# Patient Record
Sex: Male | Born: 1985
Health system: Southern US, Community
[De-identification: ages and names within clinical notes are randomized; demographics above are authoritative.]

## PROBLEM LIST (undated history)

## (undated) DIAGNOSIS — H409 Unspecified glaucoma: Secondary | ICD-10-CM

## (undated) DIAGNOSIS — E739 Lactose intolerance, unspecified: Secondary | ICD-10-CM

## (undated) DIAGNOSIS — I10 Essential (primary) hypertension: Secondary | ICD-10-CM

## (undated) DIAGNOSIS — R61 Generalized hyperhidrosis: Secondary | ICD-10-CM

## (undated) DIAGNOSIS — M549 Dorsalgia, unspecified: Secondary | ICD-10-CM

## (undated) HISTORY — PX: JOINT REPLACEMENT: SHX530

## (undated) HISTORY — PX: KNEE SURGERY: SHX244

## (undated) HISTORY — DX: Lactose intolerance, unspecified: E73.9

## (undated) HISTORY — DX: Dorsalgia, unspecified: M54.9

## (undated) HISTORY — DX: Unspecified glaucoma: H40.9

---

## 2010-02-25 ENCOUNTER — Emergency Department (HOSPITAL_BASED_OUTPATIENT_CLINIC_OR_DEPARTMENT_OTHER)
Admission: EM | Admit: 2010-02-25 | Discharge: 2010-02-25 | Payer: Self-pay | Source: Home / Self Care | Admitting: Emergency Medicine

## 2010-03-03 LAB — DIFFERENTIAL
Basophils Absolute: 0 10*3/uL (ref 0.0–0.1)
Basophils Relative: 0 % (ref 0–1)
Eosinophils Absolute: 0.1 10*3/uL (ref 0.0–0.7)
Eosinophils Relative: 2 % (ref 0–5)
Lymphocytes Relative: 30 % (ref 12–46)
Lymphs Abs: 1.1 10*3/uL (ref 0.7–4.0)
Monocytes Absolute: 0.3 10*3/uL (ref 0.1–1.0)
Monocytes Relative: 9 % (ref 3–12)
Neutro Abs: 2.1 10*3/uL (ref 1.7–7.7)
Neutrophils Relative %: 59 % (ref 43–77)

## 2010-03-03 LAB — BASIC METABOLIC PANEL
BUN: 10 mg/dL (ref 6–23)
CO2: 28 mEq/L (ref 19–32)
Calcium: 9.5 mg/dL (ref 8.4–10.5)
Chloride: 106 mEq/L (ref 96–112)
Creatinine, Ser: 1.1 mg/dL (ref 0.4–1.5)
GFR calc Af Amer: >60 mL/min (ref >60)
GFR calc non Af Amer: >60 mL/min (ref >60)
Glucose, Bld: 84 mg/dL (ref 70–99)
Potassium: 3.9 mEq/L (ref 3.5–5.1)
Sodium: 144 mEq/L (ref 135–145)

## 2010-03-03 LAB — CBC
HCT: 40 % (ref 39.0–52.0)
Hemoglobin: 14 g/dL (ref 13.0–17.0)
MCH: 29.6 pg (ref 26.0–34.0)
MCHC: 35 g/dL (ref 30.0–36.0)
MCV: 84.6 fL (ref 78.0–100.0)
Platelets: 207 K/uL (ref 150–400)
RBC: 4.73 MIL/uL (ref 4.22–5.81)
RDW: 13.2 % (ref 11.5–15.5)
WBC: 3.5 K/uL — ABNORMAL LOW (ref 4.0–10.5)

## 2010-05-10 ENCOUNTER — Emergency Department (HOSPITAL_BASED_OUTPATIENT_CLINIC_OR_DEPARTMENT_OTHER)
Admission: EM | Admit: 2010-05-10 | Discharge: 2010-05-10 | Disposition: A | Discharge status: Home / Self Care | Payer: Self-pay | Attending: Emergency Medicine | Admitting: Emergency Medicine

## 2010-05-10 DIAGNOSIS — R51 Headache: Secondary | ICD-10-CM | POA: Insufficient documentation

## 2010-05-10 DIAGNOSIS — F17200 Nicotine dependence, unspecified, uncomplicated: Secondary | ICD-10-CM | POA: Insufficient documentation

## 2010-05-12 ENCOUNTER — Emergency Department (INDEPENDENT_AMBULATORY_CARE_PROVIDER_SITE_OTHER): Payer: Self-pay

## 2010-05-12 ENCOUNTER — Emergency Department (HOSPITAL_BASED_OUTPATIENT_CLINIC_OR_DEPARTMENT_OTHER)
Admission: EM | Admit: 2010-05-12 | Discharge: 2010-05-12 | Disposition: A | Discharge status: Home / Self Care | Payer: Self-pay | Attending: Emergency Medicine | Admitting: Emergency Medicine

## 2010-05-12 DIAGNOSIS — H60399 Other infective otitis externa, unspecified ear: Secondary | ICD-10-CM | POA: Insufficient documentation

## 2010-05-12 DIAGNOSIS — F17200 Nicotine dependence, unspecified, uncomplicated: Secondary | ICD-10-CM | POA: Insufficient documentation

## 2010-05-12 DIAGNOSIS — R51 Headache: Secondary | ICD-10-CM | POA: Insufficient documentation

## 2010-05-12 DIAGNOSIS — H9209 Otalgia, unspecified ear: Secondary | ICD-10-CM

## 2010-05-12 MED ORDER — IOHEXOL 300 MG/ML  SOLN: 80.0000 mL | Freq: Once | INTRAMUSCULAR | Status: DC | PRN

## 2010-09-29 ENCOUNTER — Emergency Department (HOSPITAL_BASED_OUTPATIENT_CLINIC_OR_DEPARTMENT_OTHER)
Admission: EM | Admit: 2010-09-29 | Discharge: 2010-09-29 | Disposition: A | Discharge status: Home / Self Care | Payer: Self-pay | Attending: Emergency Medicine | Admitting: Emergency Medicine

## 2010-09-29 DIAGNOSIS — R229 Localized swelling, mass and lump, unspecified: Secondary | ICD-10-CM | POA: Insufficient documentation

## 2010-09-29 DIAGNOSIS — S0990XA Unspecified injury of head, initial encounter: Secondary | ICD-10-CM | POA: Insufficient documentation

## 2010-09-29 DIAGNOSIS — R220 Localized swelling, mass and lump, head: Secondary | ICD-10-CM

## 2010-09-29 DIAGNOSIS — Y9289 Other specified places as the place of occurrence of the external cause: Secondary | ICD-10-CM | POA: Insufficient documentation

## 2010-09-29 MED ORDER — IBUPROFEN 800 MG PO TABS: 800.0000 mg | ORAL_TABLET | Freq: Three times a day (TID) | ORAL | Status: AC

## 2010-09-29 NOTE — ED Notes (Signed)
Was swimming in the ocean and felt like he hit head on something-c/o pain/swelling to right parietal area

## 2010-09-29 NOTE — ED Notes (Signed)
Pt denies n/v, visual changes, dizziness. Pt c/o headache to right posterior head.

## 2010-09-29 NOTE — ED Provider Notes (Signed)
History     CSN: [id number] Arrival date & time: 09/29/2010 11:15 AM  Chief Complaint  Patient presents with  . Head Injury   Patient is a 25 y.o. male presenting with head injury. The history is provided by the patient.  Head Injury  The incident occurred more than 2 days ago. He came to the ER via walk-in. The injury mechanism was a direct blow. There was no loss of consciousness. There was no blood loss. The quality of the pain is described as dull and throbbing. The pain is at a severity of 5/10. The pain is moderate. The pain has been constant since the injury. Pertinent negatives include no numbness, no blurred vision, no vomiting and no tinnitus. He has tried acetaminophen for the symptoms. The treatment provided mild relief.  Pt reports that he was stung by something or hit his head on something at the beach.  Pt thinks he may have been stung by a jelly fish.  Pt complains of a hard knot on his head.  History reviewed. No pertinent past medical history.  History reviewed. No pertinent past surgical history.  No family history on file.  History  Substance Use Topics  . Smoking status: Current Everyday Smoker  . Smokeless tobacco: Not on file  . Alcohol Use: No      Review of Systems  HENT: Negative for tinnitus.   Eyes: Negative for blurred vision.  Gastrointestinal: Negative for vomiting.  Skin: Positive for wound.  Neurological: Negative for numbness.  All other systems reviewed and are negative.    Physical Exam  BP 130/84  Pulse 79  Temp(Src) 98.6 F (37 C) (Oral)  Resp 16  Ht 6\' 2"  (1.88 m)  Wt 204 lb (92.534 kg)  BMI 26.19 kg/m2  SpO2 100%  Physical Exam  Nursing note and vitals reviewed. Constitutional: He appears well-developed and well-nourished.  HENT:  Head: Normocephalic and atraumatic.  Eyes: Conjunctivae and EOM are normal. Pupils are equal, round, and reactive to light.  Neck: Normal range of motion. Neck supple.  Cardiovascular: Normal  rate.   Pulmonary/Chest: Effort normal.  Musculoskeletal: Normal range of motion.       Tender swollen area right scalp,    Neurological: He is alert.  Skin: Skin is warm.  Psychiatric: He has a normal mood and affect.    ED Course  Procedures  MDM I advised ice, ibuprofen,  I doubt head injury.      Langston Masker, [place] 09/29/10 1241

## 2010-09-30 NOTE — ED Provider Notes (Signed)
Evaluation and management procedures were performed by the PA/NP under my supervision/collaboration.   Dione Booze, MD 09/30/10 [phone]

## 2010-10-30 ENCOUNTER — Encounter (HOSPITAL_BASED_OUTPATIENT_CLINIC_OR_DEPARTMENT_OTHER): Payer: Self-pay | Admitting: *Deleted

## 2010-10-30 ENCOUNTER — Emergency Department (HOSPITAL_BASED_OUTPATIENT_CLINIC_OR_DEPARTMENT_OTHER)
Admission: EM | Admit: 2010-10-30 | Discharge: 2010-10-30 | Disposition: A | Discharge status: Home / Self Care | Payer: Self-pay | Attending: Emergency Medicine | Admitting: Emergency Medicine

## 2010-10-30 DIAGNOSIS — R51 Headache: Secondary | ICD-10-CM | POA: Insufficient documentation

## 2010-10-30 DIAGNOSIS — G8929 Other chronic pain: Secondary | ICD-10-CM | POA: Insufficient documentation

## 2010-10-30 MED ORDER — IBUPROFEN 800 MG PO TABS: 800.0000 mg | ORAL_TABLET | Freq: Three times a day (TID) | ORAL | Status: AC

## 2010-10-30 MED ORDER — SUMATRIPTAN SUCCINATE 50 MG PO TABS: 50.0000 mg | ORAL_TABLET | ORAL | Status: DC | PRN

## 2010-10-30 MED ORDER — SUMATRIPTAN SUCCINATE 100 MG PO TABS: 100.0000 mg | ORAL_TABLET | ORAL | Status: DC | PRN

## 2010-10-30 MED ORDER — KETOROLAC TROMETHAMINE 60 MG/2ML IM SOLN
60.0000 mg | Freq: Once | INTRAMUSCULAR | Status: AC
  Administered 2010-10-30: 60 mg via INTRAMUSCULAR
  Filled 2010-10-30: qty 2

## 2010-10-30 MED ORDER — DIPHENHYDRAMINE HCL 25 MG PO CAPS
25.0000 mg | ORAL_CAPSULE | Freq: Once | ORAL | Status: AC
  Administered 2010-10-30: 25 mg via ORAL
  Filled 2010-10-30 (×2): qty 1

## 2010-10-30 NOTE — ED Notes (Signed)
Dr Miller at bedside. 

## 2010-10-30 NOTE — ED Provider Notes (Signed)
History     CSN: [id number] Arrival date & time: 10/30/2010  6:13 AM  Chief Complaint  Patient presents with  . Headache   HPI Comments: She states that he has had headaches for the past 3 months which are intermittent, nothing makes it better or worse, will eventually resolve spontaneously, unilateral on the right and a throbbing feeling. He has no associated fever, stiff neck, weakness, numbness, blurred vision. He does note that he has an aura that precedes each of his headaches which includes seeing blurry lines across his vision. He exercises daily including cardio and weights and has minimal difficulty with this task. He has tried Tylenol prior to arrival with no improvement.  He does not drink caffeine, use alcohol, or drugs. He has the occasional cigarette. He denies any other prescription or over-the-counter medications and uses no supplement  Patient is a 25 y.o. male presenting with headaches. The history is provided by the patient, medical records and a relative.  Headache  This is a chronic problem. Episode onset: 3 months ago. Episode frequency: Intermittently, every other week. The problem has not changed since onset.The headache is associated with nothing. The pain is located in the right unilateral region. The quality of the pain is described as throbbing. The pain is moderate. The pain does not radiate. Pertinent negatives include no anorexia, no fever, no malaise/fatigue, no chest pressure, no near-syncope, no orthopnea, no palpitations, no syncope, no shortness of breath, no nausea and no vomiting. Associated symptoms comments: Positive for photophobia and phonophobia. Has pre-headache aura. He has tried acetaminophen for the symptoms. The treatment provided no relief.    History reviewed. No pertinent past medical history.  History reviewed. No pertinent past surgical history.  No family history on file.  History  Substance Use Topics  . Smoking status: Current Some Day  Smoker  . Smokeless tobacco: Not on file  . Alcohol Use: No      Review of Systems  Constitutional: Negative for fever and malaise/fatigue.  Respiratory: Negative for shortness of breath.   Cardiovascular: Negative for palpitations, orthopnea, syncope and near-syncope.  Gastrointestinal: Negative for nausea, vomiting and anorexia.  Neurological: Positive for headaches.  All other systems reviewed and are negative.    Physical Exam  BP 135/85  Pulse 70  Temp(Src) 98.2 F (36.8 C) (Oral)  Resp 18  Ht 6\' 2"  (1.88 m)  Wt 204 lb (92.534 kg)  BMI 26.19 kg/m2  SpO2 100%  Physical Exam  Nursing note and vitals reviewed. Constitutional: He appears well-developed and well-nourished. No distress.  HENT:  Head: Normocephalic and atraumatic.  Mouth/Throat: Oropharynx is clear and moist. No oropharyngeal exudate.       No tenderness to palpation over the scalp or temporal artery  Eyes: Conjunctivae and EOM are normal. Pupils are equal, round, and reactive to light. Right eye exhibits no discharge. Left eye exhibits no discharge. No scleral icterus.  Neck: Normal range of motion. Neck supple. No JVD present. No thyromegaly present.  Cardiovascular: Normal rate, regular rhythm, normal heart sounds and intact distal pulses.  Exam reveals no gallop and no friction rub.   No murmur heard. Pulmonary/Chest: Effort normal and breath sounds normal. No respiratory distress. He has no wheezes. He has no rales.  Abdominal: Soft. Bowel sounds are normal. He exhibits no distension and no mass. There is no tenderness.  Musculoskeletal: Normal range of motion. He exhibits no edema and no tenderness.  Lymphadenopathy:    He has no cervical adenopathy.  Neurological: He is alert. Coordination normal.  Skin: Skin is warm and dry. No rash noted. No erythema.  Psychiatric: He has a normal mood and affect. His behavior is normal.    ED Course  Procedures  MDM Very normal exam, vital signs,  intramuscular Toradol. Likely has a migraine disorder and will need followup with neurology. We'll give prescription for Imitrex at home. Precautions for followup given. Understanding expressed      Vida Roller, MD 10/30/10 [phone]

## 2010-10-30 NOTE — ED Notes (Signed)
C/o intermittent headaches x3 months. Most recent HA started 2nights ago. Pt has sensitivity to light and noise. C/o dizziness yesterday. Denies N/V or fevers.

## 2011-02-05 ENCOUNTER — Encounter (HOSPITAL_BASED_OUTPATIENT_CLINIC_OR_DEPARTMENT_OTHER): Payer: Self-pay | Admitting: *Deleted

## 2011-02-05 ENCOUNTER — Emergency Department (HOSPITAL_BASED_OUTPATIENT_CLINIC_OR_DEPARTMENT_OTHER)
Admission: EM | Admit: 2011-02-05 | Discharge: 2011-02-05 | Disposition: A | Discharge status: Home / Self Care | Payer: Self-pay | Attending: Emergency Medicine | Admitting: Emergency Medicine

## 2011-02-05 DIAGNOSIS — S29012A Strain of muscle and tendon of back wall of thorax, initial encounter: Secondary | ICD-10-CM

## 2011-02-05 DIAGNOSIS — Y9289 Other specified places as the place of occurrence of the external cause: Secondary | ICD-10-CM | POA: Insufficient documentation

## 2011-02-05 DIAGNOSIS — S239XXA Sprain of unspecified parts of thorax, initial encounter: Secondary | ICD-10-CM | POA: Insufficient documentation

## 2011-02-05 DIAGNOSIS — X503XXA Overexertion from repetitive movements, initial encounter: Secondary | ICD-10-CM | POA: Insufficient documentation

## 2011-02-05 MED ORDER — KETOROLAC TROMETHAMINE 60 MG/2ML IM SOLN
60.0000 mg | Freq: Once | INTRAMUSCULAR | Status: AC
  Administered 2011-02-05: 60 mg via INTRAMUSCULAR
  Filled 2011-02-05: qty 2

## 2011-02-05 MED ORDER — CYCLOBENZAPRINE HCL 10 MG PO TABS: 10.0000 mg | ORAL_TABLET | Freq: Two times a day (BID) | ORAL | Status: AC | PRN

## 2011-02-05 NOTE — ED Provider Notes (Signed)
History     CSN: [id number] Arrival date & time: 02/05/2011  6:41 AM   None     Chief Complaint  Patient presents with  . Back Pain    (Consider location/radiation/quality/duration/timing/severity/associated sxs/prior treatment) Patient is a 25 y.o. male presenting with back pain. The history is provided by the patient.  Back Pain  This is a new problem. The current episode started 12 to 24 hours ago. The problem occurs constantly. The problem has been gradually worsening. The pain is associated with lifting heavy objects (He was throwing mailbags and felt a pop in his right side). The pain is present in the lumbar spine. The quality of the pain is described as stabbing and shooting. The pain does not radiate. The pain is at a severity of 10/10. The pain is severe. The symptoms are aggravated by bending, twisting and certain positions. The pain is the same all the time. Pertinent negatives include no chest pain, no numbness, no leg pain, no paresthesias, no paresis, no tingling and no weakness. He has tried ice and heat for the symptoms. The treatment provided no relief.    Past Medical History  Diagnosis Date  . Migraine     History reviewed. No pertinent past surgical history.  No family history on file.  History  Substance Use Topics  . Smoking status: Former Games developer  . Smokeless tobacco: Not on file  . Alcohol Use: No      Review of Systems  Cardiovascular: Negative for chest pain.  Musculoskeletal: Positive for back pain.  Neurological: Negative for tingling, weakness, numbness and paresthesias.  All other systems reviewed and are negative.    Allergies  Review of patient's allergies indicates no known allergies.  Home Medications   Current Outpatient Rx  Name Route Sig Dispense Refill  . SUMATRIPTAN SUCCINATE 50 MG PO TABS Oral Take 1 tablet (50 mg total) by mouth every 2 (two) hours as needed for migraine (maximum 200mg  in one day). 20 tablet 0    BP  146/64  Pulse 66  Temp(Src) 98.1 F (36.7 C) (Oral)  Resp 16  Ht 6\' 2"  (1.88 m)  Wt 201 lb (91.173 kg)  BMI 25.81 kg/m2  SpO2 99%  Physical Exam  Nursing note and vitals reviewed. Constitutional: He is oriented to person, place, and time. He appears well-developed and well-nourished.       Uncomfortable appearing leaning forward in the bed  HENT:  Head: Normocephalic and atraumatic.  Eyes: EOM are normal. Pupils are equal, round, and reactive to light.  Cardiovascular: Normal rate, normal heart sounds and intact distal pulses.   Pulmonary/Chest: Effort normal and breath sounds normal. He has no wheezes. He has no rales. He exhibits no tenderness.  Musculoskeletal:       Thoracic back: He exhibits tenderness, pain and spasm. He exhibits no bony tenderness.       Arms: Neurological: He is alert and oriented to person, place, and time. He has normal strength. No sensory deficit.  Skin: Skin is warm and dry. No erythema.    ED Course  Procedures (including critical care time)  Labs Reviewed - No data to display No results found.   No diagnosis found.    MDM   Patient with symptoms consistent with muscle sprain. 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 Pain only in the right latissimus dorsi. No shortness of breath or respiratory symptoms. No neurologic symptoms the pain  does not radiate into the legs. Will treat with anti-inflammatories and muscle relaxer.        Gwyneth Sprout, MD 02/05/11 [phone]

## 2011-02-05 NOTE — ED Notes (Signed)
C/o right sided low back pain after lifting heavy bags

## 2011-03-18 ENCOUNTER — Encounter (HOSPITAL_BASED_OUTPATIENT_CLINIC_OR_DEPARTMENT_OTHER): Payer: Self-pay | Admitting: *Deleted

## 2011-03-18 ENCOUNTER — Emergency Department (HOSPITAL_BASED_OUTPATIENT_CLINIC_OR_DEPARTMENT_OTHER)
Admission: EM | Admit: 2011-03-18 | Discharge: 2011-03-18 | Disposition: A | Discharge status: Home / Self Care | Payer: Self-pay | Attending: Emergency Medicine | Admitting: Emergency Medicine

## 2011-03-18 DIAGNOSIS — G43909 Migraine, unspecified, not intractable, without status migrainosus: Secondary | ICD-10-CM | POA: Insufficient documentation

## 2011-03-18 DIAGNOSIS — H53149 Visual discomfort, unspecified: Secondary | ICD-10-CM | POA: Insufficient documentation

## 2011-03-18 MED ORDER — METOCLOPRAMIDE HCL 5 MG/ML IJ SOLN
10.0000 mg | Freq: Once | INTRAMUSCULAR | Status: AC
  Administered 2011-03-18: 10 mg via INTRAVENOUS
  Filled 2011-03-18: qty 2

## 2011-03-18 NOTE — ED Provider Notes (Signed)
History     CSN: [id number]  Arrival date & time 03/18/11  1844   First MD Initiated Contact with Patient 03/18/11 1857      Chief Complaint  Patient presents with  . Migraine    (Consider location/radiation/quality/duration/timing/severity/associated sxs/prior treatment) HPI Complains of migraine headache throbbing in nature right-sided onset upon awakening this morning typical of migraine she gets 3 or 4 times per day for the past 2 years no associated nausea. Admits to photophobia no other complaint. Treated with sumatriptan without relief. Past Medical History  Diagnosis Date  . Migraine     Past Surgical History  Procedure Date  . Joint replacement     History reviewed. No pertinent family history.  History  Substance Use Topics  . Smoking status: Former Games developer  . Smokeless tobacco: Not on file  . Alcohol Use: No   No tobacco no alcohol no drug   Review of Systems  Constitutional: Negative.   HENT: Negative.   Eyes: Positive for photophobia.  Respiratory: Negative.   Cardiovascular: Negative.   Gastrointestinal: Negative.   Musculoskeletal: Negative.   Skin: Negative.   Neurological: Negative.        Headache  Hematological: Negative.   Psychiatric/Behavioral: Negative.     Allergies  Review of patient's allergies indicates no known allergies.  Home Medications   Current Outpatient Rx  Name Route Sig Dispense Refill  . ADULT MULTIVITAMIN W/MINERALS CH Oral Take 1 tablet by mouth daily.    . SUMATRIPTAN SUCCINATE 50 MG PO TABS Oral Take 1 tablet (50 mg total) by mouth every 2 (two) hours as needed for migraine (maximum 200mg  in one day). 20 tablet 0    BP 124/72  Pulse 74  Temp(Src) 99.4 F (37.4 C) (Oral)  Resp 18  Ht 6\' 2"  (1.88 m)  Wt 202 lb (91.627 kg)  BMI 25.94 kg/m2  SpO2 100%  Physical Exam  Constitutional: He appears well-developed and well-nourished.  HENT:  Head: Normocephalic and atraumatic.  Eyes: Conjunctivae are normal.  Pupils are equal, round, and reactive to light.       Fundi not well visualized  Neck: Neck supple. No tracheal deviation present. No thyromegaly present.  Cardiovascular: Normal rate and regular rhythm.   No murmur heard. Pulmonary/Chest: Effort normal and breath sounds normal.  Abdominal: Soft. Bowel sounds are normal. He exhibits no distension. There is no tenderness.  Musculoskeletal: Normal range of motion. He exhibits no edema and no tenderness.  Neurological: He is alert. He has normal reflexes. Coordination normal.       Gait normal pronator drift normal Romberg normal  Skin: Skin is warm and dry. No rash noted.  Psychiatric: He has a normal mood and affect. His behavior is normal. Judgment normal.    ED Course  Procedures (including critical care time) 8 PM feels much improved her vertigo home alert Glasgow Coma Score 15 Labs Reviewed - No data to display No results found.   No diagnosis found.    MDM   No further treatment needed, referral to [facility] Diagnosis migraine headache       Doug Sou, MD 03/18/11 2001

## 2011-03-18 NOTE — ED Notes (Signed)
Pt c/o " migraine all day" w/o relief from meds

## 2012-01-04 ENCOUNTER — Emergency Department (HOSPITAL_BASED_OUTPATIENT_CLINIC_OR_DEPARTMENT_OTHER)
Admission: EM | Admit: 2012-01-04 | Discharge: 2012-01-04 | Disposition: A | Discharge status: Home / Self Care | Payer: Self-pay | Attending: Emergency Medicine | Admitting: Emergency Medicine

## 2012-01-04 ENCOUNTER — Encounter (HOSPITAL_BASED_OUTPATIENT_CLINIC_OR_DEPARTMENT_OTHER): Payer: Self-pay | Admitting: *Deleted

## 2012-01-04 ENCOUNTER — Emergency Department (HOSPITAL_BASED_OUTPATIENT_CLINIC_OR_DEPARTMENT_OTHER): Payer: Self-pay

## 2012-01-04 DIAGNOSIS — G43909 Migraine, unspecified, not intractable, without status migrainosus: Secondary | ICD-10-CM | POA: Insufficient documentation

## 2012-01-04 DIAGNOSIS — I824Z9 Acute embolism and thrombosis of unspecified deep veins of unspecified distal lower extremity: Secondary | ICD-10-CM | POA: Insufficient documentation

## 2012-01-04 DIAGNOSIS — Z87891 Personal history of nicotine dependence: Secondary | ICD-10-CM | POA: Insufficient documentation

## 2012-01-04 DIAGNOSIS — Z79899 Other long term (current) drug therapy: Secondary | ICD-10-CM | POA: Insufficient documentation

## 2012-01-04 LAB — CBC WITH DIFFERENTIAL/PLATELET
Basophils Absolute: 0 10*3/uL (ref 0.0–0.1)
Basophils Relative: 0 % (ref 0–1)
Eosinophils Absolute: 0 10*3/uL (ref 0.0–0.7)
Eosinophils Relative: 1 % (ref 0–5)
HCT: 38 % — ABNORMAL LOW (ref 39.0–52.0)
Hemoglobin: 13.2 g/dL (ref 13.0–17.0)
Lymphocytes Relative: 31 % (ref 12–46)
Lymphs Abs: 1.5 10*3/uL (ref 0.7–4.0)
MCH: 30.7 pg (ref 26.0–34.0)
MCHC: 34.7 g/dL (ref 30.0–36.0)
MCV: 88.4 fL (ref 78.0–100.0)
Monocytes Absolute: 0.5 10*3/uL (ref 0.1–1.0)
Monocytes Relative: 11 % (ref 3–12)
Neutro Abs: 2.7 10*3/uL (ref 1.7–7.7)
Neutrophils Relative %: 57 % (ref 43–77)
Platelets: 191 10*3/uL (ref 150–400)
RBC: 4.3 MIL/uL (ref 4.22–5.81)
RDW: 13 % (ref 11.5–15.5)
WBC: 4.8 10*3/uL (ref 4.0–10.5)

## 2012-01-04 LAB — BASIC METABOLIC PANEL
BUN: 12 mg/dL (ref 6–23)
CO2: 27 mEq/L (ref 19–32)
Calcium: 9.8 mg/dL (ref 8.4–10.5)
Chloride: 101 mEq/L (ref 96–112)
Creatinine, Ser: 1.1 mg/dL (ref 0.50–1.35)
GFR calc Af Amer: >90 mL/min (ref >90)
GFR calc non Af Amer: >90 mL/min (ref >90)
Glucose, Bld: 89 mg/dL (ref 70–99)
Potassium: 4.5 mEq/L (ref 3.5–5.1)
Sodium: 138 mEq/L (ref 135–145)

## 2012-01-04 LAB — CK: Total CK: 656 U/L — ABNORMAL HIGH (ref 7–232)

## 2012-01-04 MED ORDER — ASPIRIN 325 MG PO TABS: 325.0000 mg | ORAL_TABLET | Freq: Every day | ORAL | Status: DC

## 2012-01-04 NOTE — ED Notes (Signed)
MD at bedside. 

## 2012-01-04 NOTE — ED Notes (Signed)
Returned from [facility]

## 2012-01-04 NOTE — ED Provider Notes (Signed)
History  This chart was scribed for [name], MD by Ardeen Jourdain, ED Scribe. This patient was seen in room MH10/MH10 and the patient's care was started at 1609.  CSN: [id number]  Arrival date & time 01/04/12  1450   First MD Initiated Contact with Patient 01/04/12 1609      Chief Complaint  Patient presents with  . Leg Swelling     The history is provided by the patient. No language interpreter was used.    Adam Alvarez is a 26 y.o. male who presents to the Emergency Department complaining of lower leg swelling with associated pain that began 2 days ago after running half marathon. He denies problems urinating, abdominal pain, fever, and SOB. He states that during the race his leg started swelling, burning and he felt pain. He reports breaking the knee in the past. He has a h/o migraines. He is a former smoker but denies alcohol use.   Past Medical History  Diagnosis Date  . Migraine     Past Surgical History  Procedure Date  . Joint replacement     History reviewed. No pertinent family history.  History  Substance Use Topics  . Smoking status: Former Games developer  . Smokeless tobacco: Not on file  . Alcohol Use: No      Review of Systems  All other systems reviewed and are negative.  A complete 10 system review of systems was obtained and all systems are negative except as noted in the HPI and PMH.    Allergies  Review of patient's allergies indicates no known allergies.  Home Medications   Current Outpatient Rx  Name  Route  Sig  Dispense  Refill  . ADULT MULTIVITAMIN W/MINERALS CH   Oral   Take 1 tablet by mouth daily.         . SUMATRIPTAN SUCCINATE 50 MG PO TABS   Oral   Take 1 tablet (50 mg total) by mouth every 2 (two) hours as needed for migraine (maximum 200mg  in one day).   20 tablet   0     Triage Vitals: BP 136/69  Pulse 70  Temp 98.8 F (37.1 C) (Oral)  Resp 16  Ht 6\' 3"  (1.905 m)  Wt 210 lb (95.255 kg)  BMI 26.25  kg/m2  SpO2 100%  Physical Exam  Nursing note and vitals reviewed. Constitutional: He is oriented to person, place, and time. He appears well-developed and well-nourished.  HENT:  Head: Normocephalic and atraumatic.  Eyes: EOM are normal. Pupils are equal, round, and reactive to light.  Neck: Normal range of motion. Neck supple.  Cardiovascular: Normal rate, normal heart sounds and intact distal pulses.   Pulmonary/Chest: Effort normal and breath sounds normal.  Abdominal: Bowel sounds are normal. He exhibits no distension. There is no tenderness.  Musculoskeletal: Normal range of motion. He exhibits tenderness. He exhibits no edema.       Tenderness and fullness behind right knee and into right calf, no edema, normal pulses  Neurological: He is alert and oriented to person, place, and time. He has normal strength. No cranial nerve deficit or sensory deficit.  Skin: Skin is warm and dry. No rash noted.  Psychiatric: He has a normal mood and affect.    ED Course  Procedures (including critical care time)  DIAGNOSTIC STUDIES: Oxygen Saturation is 100% on room air, normal by my interpretation.    COORDINATION OF CARE:  4:17 PM: Discussed treatment plan which includes blood work and  an ultrasound with pt at bedside and pt agreed to plan.    Results for orders placed during the hospital encounter of 01/04/12  CBC WITH DIFFERENTIAL      Component Value Range   WBC 4.8  4.0 - 10.5 K/uL   RBC 4.30  4.22 - 5.81 MIL/uL   Hemoglobin 13.2  13.0 - 17.0 g/dL   HCT 62.1 (*) 30.8 - 65.7 %   MCV 88.4  78.0 - 100.0 fL   MCH 30.7  26.0 - 34.0 pg   MCHC 34.7  30.0 - 36.0 g/dL   RDW 84.6  96.2 - 95.2 %   Platelets 191  150 - 400 K/uL   Neutrophils Relative 57  43 - 77 %   Neutro Abs 2.7  1.7 - 7.7 K/uL   Lymphocytes Relative 31  12 - 46 %   Lymphs Abs 1.5  0.7 - 4.0 K/uL   Monocytes Relative 11  3 - 12 %   Monocytes Absolute 0.5  0.1 - 1.0 K/uL   Eosinophils Relative 1  0 - 5 %    Eosinophils Absolute 0.0  0.0 - 0.7 K/uL   Basophils Relative 0  0 - 1 %   Basophils Absolute 0.0  0.0 - 0.1 K/uL  BASIC METABOLIC PANEL      Component Value Range   Sodium 138  135 - 145 mEq/L   Potassium 4.5  3.5 - 5.1 mEq/L   Chloride 101  96 - 112 mEq/L   CO2 27  19 - 32 mEq/L   Glucose, Bld 89  70 - 99 mg/dL   BUN 12  6 - 23 mg/dL   Creatinine, Ser 8.41  0.50 - 1.35 mg/dL   Calcium 9.8  8.4 - 32.4 mg/dL   GFR calc non Af Amer >90  >90 mL/min   GFR calc Af Amer >90  >90 mL/min  CK      Component Value Range   Total CK 656 (*) 7 - 232 U/L   US Venous Img Lower Unilateral Right  01/04/2012  *RADIOLOGY REPORT*  Clinical Data: Leg pain, swelling and numbness, recently ran a marathon  RIGHT LOWER EXTREMITY VENOUS DUPLEX ULTRASOUND  Technique:  Gray-scale sonography with graded compression, as well as color Doppler and duplex ultrasound, were performed to evaluate the deep venous system of the lower extremity from the level of the common femoral vein through the popliteal and proximal calf veins. Spectral Doppler was utilized to evaluate flow at rest and with distal augmentation maneuvers.  Comparison:  None  Findings: Deep venous system patent and compressible from right groin through popliteal fossa. Spontaneous venous flow present with intact augmentation and evidence of respiratory phasicity. No intraluminal thrombus identified. Visualized portion of the greater saphenous system unremarkable. No evidence of Baker's cyst identified.  In the right calf, the peroneal veins appear patent and compressible, and demonstrate spontaneous venous flow. The distal portions of the posterior tibial veins however demonstrate impaired compressibility and lack of spontaneous flow on color Doppler imaging. Findings represent deep venous thrombosis within the calf in the posterior tibial veins.  IMPRESSION: No evidence of above knee deep venous thrombosis. Impaired compressibility and spontaneous venous flow  within portions of the right posterior tibial veins most likely representing deep venous thrombosis in the calf.   Original Report Authenticated By: Ulyses Southward, M.D.      No diagnosis found.    MDM  Discussed [place] results with Dr. Arbie Cookey on call for Vascular Surgery.  He recommends a daily aspirin and recheck of [place] in 1 week to evaluate for propagation of the clot. Does not recommend anticoagulation at this time given distal findings. Pt has no PE symptoms. Has no established PCP, given resource guide and advised to return to the ED in one week if unable to schedule an appointment.       I personally performed the services described in this documentation, which was scribed in my presence. The recorded information has been reviewed and is accurate.    Denis Carreon B. Bernette Mayers, MD 01/04/12 [phone]

## 2012-01-04 NOTE — ED Notes (Signed)
Pt c/o right lower leg swelling and pain after running sat

## 2012-01-10 ENCOUNTER — Emergency Department (HOSPITAL_BASED_OUTPATIENT_CLINIC_OR_DEPARTMENT_OTHER): Payer: Self-pay

## 2012-01-10 ENCOUNTER — Encounter (HOSPITAL_BASED_OUTPATIENT_CLINIC_OR_DEPARTMENT_OTHER): Payer: Self-pay

## 2012-01-10 ENCOUNTER — Emergency Department (HOSPITAL_BASED_OUTPATIENT_CLINIC_OR_DEPARTMENT_OTHER)
Admission: EM | Admit: 2012-01-10 | Discharge: 2012-01-10 | Disposition: A | Discharge status: Home / Self Care | Payer: Self-pay | Attending: Emergency Medicine | Admitting: Emergency Medicine

## 2012-01-10 DIAGNOSIS — Z8669 Personal history of other diseases of the nervous system and sense organs: Secondary | ICD-10-CM | POA: Insufficient documentation

## 2012-01-10 DIAGNOSIS — I824Z9 Acute embolism and thrombosis of unspecified deep veins of unspecified distal lower extremity: Secondary | ICD-10-CM

## 2012-01-10 DIAGNOSIS — Z79899 Other long term (current) drug therapy: Secondary | ICD-10-CM | POA: Insufficient documentation

## 2012-01-10 DIAGNOSIS — Z87891 Personal history of nicotine dependence: Secondary | ICD-10-CM | POA: Insufficient documentation

## 2012-01-10 DIAGNOSIS — I82409 Acute embolism and thrombosis of unspecified deep veins of unspecified lower extremity: Secondary | ICD-10-CM | POA: Insufficient documentation

## 2012-01-10 DIAGNOSIS — Z7982 Long term (current) use of aspirin: Secondary | ICD-10-CM | POA: Insufficient documentation

## 2012-01-10 NOTE — ED Provider Notes (Signed)
History     CSN: [id number]  Arrival date & time 01/10/12  1133   First MD Initiated Contact with Patient 01/10/12 1144      Chief Complaint  Patient presents with  . Follow-up    (Consider location/radiation/quality/duration/timing/severity/associated sxs/prior treatment) HPI Pt presents for re-evaluation from visit about a week ago for RLE pain. At that time had [place] concerning for small distal DVT, started on ASA and advised to return for evaluation of propagation in one week. States pain and swelling is improved.   Past Medical History  Diagnosis Date  . Migraine     Past Surgical History  Procedure Date  . Joint replacement   . Knee surgery     No family history on file.  History  Substance Use Topics  . Smoking status: Former Games developer  . Smokeless tobacco: Not on file  . Alcohol Use: No      Review of Systems All other systems reviewed and are negative except as noted in HPI.   Allergies  Review of patient's allergies indicates no known allergies.  Home Medications   Current Outpatient Rx  Name  Route  Sig  Dispense  Refill  . ASPIRIN 325 MG PO TABS   Oral   Take 1 tablet (325 mg total) by mouth daily.   30 tablet   0   . ADULT MULTIVITAMIN W/MINERALS CH   Oral   Take 1 tablet by mouth daily.         . SUMATRIPTAN SUCCINATE 50 MG PO TABS   Oral   Take 1 tablet (50 mg total) by mouth every 2 (two) hours as needed for migraine (maximum 200mg  in one day).   20 tablet   0     BP 137/59  Pulse 66  Temp 98.9 F (37.2 C) (Oral)  Resp 18  Ht 6\' 3"  (1.905 m)  Wt 210 lb (95.255 kg)  BMI 26.25 kg/m2  SpO2 100%  Physical Exam  Constitutional: He is oriented to person, place, and time. He appears well-developed and well-nourished.  HENT:  Head: Normocephalic and atraumatic.  Neck: Neck supple.  Pulmonary/Chest: Effort normal.  Musculoskeletal: Normal range of motion. He exhibits no edema and no tenderness.  Neurological: He is alert and  oriented to person, place, and time. No cranial nerve deficit.  Psychiatric: He has a normal mood and affect. His behavior is normal.    ED Course  Procedures (including critical care time)  Labs Reviewed - No data to display US Venous Img Lower Unilateral Right  01/10/2012  *RADIOLOGY REPORT*  Clinical Data: Rechecked of right lower extremity DVT with prior ultrasound suggesting noncompressible posterior tibial vein.  RIGHT LOWER EXTREMITY VENOUS DUPLEX ULTRASOUND  Technique:  Gray-scale sonography with graded compression, as well as color Doppler and duplex ultrasound, were performed to evaluate the deep venous system of the lower extremity from the level of the common femoral vein through the popliteal and proximal calf veins. Spectral Doppler was utilized to evaluate flow at rest and with distal augmentation maneuvers.  Comparison:  01/04/2012  Findings: There clearly is no propagation of DVT since the prior study.  There remains subtle noncompressibility of the distal posterior tibial vein near the ankle.  This is of unclear significance as this segment of the vein is quite small in caliber and difficult to visualize.  If this does represent residual thrombus, it is a minute amount of thrombus.  Major deep veins of the right lower extremity are normally patent.  There is no evidence of superficial thrombophlebitis.  IMPRESSION: No progression of DVT since the prior study.  There remains a short segment of the posterior tibial vein near the ankle that may contain a small amount of residual thrombus.   Original Report Authenticated By: Irish Lack, M.D.      No diagnosis found.    MDM  [place] as above essentially unchanged. Advised to continue daily ASA. Return for worsening.         Charles B. Bernette Mayers, MD 01/10/12 1256

## 2012-01-10 NOTE — ED Notes (Signed)
Recheck right leg DVT

## 2012-02-14 ENCOUNTER — Ambulatory Visit (HOSPITAL_BASED_OUTPATIENT_CLINIC_OR_DEPARTMENT_OTHER)
Admit: 2012-02-14 | Discharge: 2012-02-14 | Disposition: A | Discharge status: Home / Self Care | Payer: Self-pay | Attending: Emergency Medicine | Admitting: Emergency Medicine

## 2012-02-14 ENCOUNTER — Encounter (HOSPITAL_BASED_OUTPATIENT_CLINIC_OR_DEPARTMENT_OTHER): Payer: Self-pay | Admitting: *Deleted

## 2012-02-14 ENCOUNTER — Emergency Department (HOSPITAL_BASED_OUTPATIENT_CLINIC_OR_DEPARTMENT_OTHER)
Admission: EM | Admit: 2012-02-14 | Discharge: 2012-02-14 | Disposition: A | Discharge status: Home / Self Care | Payer: Self-pay | Attending: Emergency Medicine | Admitting: Emergency Medicine

## 2012-02-14 DIAGNOSIS — Z96659 Presence of unspecified artificial knee joint: Secondary | ICD-10-CM | POA: Insufficient documentation

## 2012-02-14 DIAGNOSIS — M79609 Pain in unspecified limb: Secondary | ICD-10-CM | POA: Insufficient documentation

## 2012-02-14 DIAGNOSIS — M79606 Pain in leg, unspecified: Secondary | ICD-10-CM

## 2012-02-14 DIAGNOSIS — Z87891 Personal history of nicotine dependence: Secondary | ICD-10-CM | POA: Insufficient documentation

## 2012-02-14 DIAGNOSIS — Z7982 Long term (current) use of aspirin: Secondary | ICD-10-CM | POA: Insufficient documentation

## 2012-02-14 DIAGNOSIS — Z8679 Personal history of other diseases of the circulatory system: Secondary | ICD-10-CM | POA: Insufficient documentation

## 2012-02-14 DIAGNOSIS — Z79899 Other long term (current) drug therapy: Secondary | ICD-10-CM | POA: Insufficient documentation

## 2012-02-14 LAB — GLUCOSE, CAPILLARY: Glucose-Capillary: 89 mg/dL (ref 70–99)

## 2012-02-14 MED ORDER — PREDNISONE 10 MG PO TABS: 20.0000 mg | ORAL_TABLET | Freq: Two times a day (BID) | ORAL | Status: DC

## 2012-02-14 NOTE — ED Notes (Signed)
Patient to return at 13:00 for ultrasound of lower extremity  Incorrect charting for n/v/d

## 2012-02-14 NOTE — ED Notes (Addendum)
Patient c/o R leg pain, numbness, aches. States he came here a month ago and placed on  aspirin each day for blood clot

## 2012-02-14 NOTE — ED Provider Notes (Signed)
History     CSN: [id number]  Arrival date & time 02/14/12  0919   First MD Initiated Contact with Patient 02/14/12 [phone]      Chief Complaint  Patient presents with  . Leg Pain    (Consider location/radiation/quality/duration/timing/severity/associated sxs/prior treatment) HPI Comments: Patient with history of blood clot in the right posterior tibial vein that was treated with aspirin.  He returns today complaining of worsening numbness in the right leg.  He has some back pain but denies injury, bowel, or bladder complaints.  There is no chest pain or shortness of breath.  Patient is a 26 y.o. male presenting with leg pain. The history is provided by the patient.  Leg Pain  There was no injury mechanism. The pain location is generalized.    Past Medical History  Diagnosis Date  . Migraine     Past Surgical History  Procedure Date  . Joint replacement   . Knee surgery     No family history on file.  History  Substance Use Topics  . Smoking status: Former Games developer  . Smokeless tobacco: Not on file  . Alcohol Use: No      Review of Systems  All other systems reviewed and are negative.    Allergies  Review of patient's allergies indicates no known allergies.  Home Medications   Current Outpatient Rx  Name  Route  Sig  Dispense  Refill  . ASPIRIN 325 MG PO TABS   Oral   Take 1 tablet (325 mg total) by mouth daily.   30 tablet   0   . ADULT MULTIVITAMIN W/MINERALS CH   Oral   Take 1 tablet by mouth daily.         . SUMATRIPTAN SUCCINATE 50 MG PO TABS   Oral   Take 1 tablet (50 mg total) by mouth every 2 (two) hours as needed for migraine (maximum 200mg  in one day).   20 tablet   0     BP 149/48  Pulse 66  Temp 98.1 F (36.7 C) (Oral)  Resp 18  Ht 6\' 2"  (1.88 m)  Wt 210 lb (95.255 kg)  BMI 26.96 kg/m2  SpO2 100%  Physical Exam  Nursing note and vitals reviewed. Constitutional: He is oriented to person, place, and time. He appears  well-developed and well-nourished. No distress.  HENT:  Head: Normocephalic and atraumatic.  Mouth/Throat: Oropharynx is clear and moist.  Neck: Normal range of motion. Neck supple.  Cardiovascular: Normal rate and regular rhythm.   No murmur heard. Pulmonary/Chest: Effort normal and breath sounds normal.  Abdominal: Soft. Bowel sounds are normal.  Musculoskeletal: Normal range of motion.       The right lower extremity appears grossly normal.  There is no swelling or edema.  The DP and PT pulses are intact.  Homan sign is absent.  Neurological: He is alert and oriented to person, place, and time.       DTR's are 2+ and equal in the ble.  Strength is 5/5 in the ble.  Able to ambulate without difficulty.  Skin: Skin is warm and dry. He is not diaphoretic.    ED Course  Procedures (including critical care time)  Labs Reviewed - No data to display No results found.   No diagnosis found.    MDM  I want to do an [place] to ensure the clot is not extending into the deep veins.  [place] comes in at 1PM.  I will discharge him and  have him return to radiology then.          Geoffery Lyons, MD 02/14/12 1021

## 2012-02-14 NOTE — Discharge Instructions (Signed)
Musculoskeletal Pain  Musculoskeletal pain is muscle and boney aches and pains. These pains can occur in any part of the body. Your caregiver may treat you without knowing the cause of the pain. They may treat you if blood or urine tests, X-rays, and other tests were normal.   CAUSES  There is often not a definite cause or reason for these pains. These pains may be caused by a type of germ (virus). The discomfort may also come from overuse. Overuse includes working out too hard when your body is not fit. Boney aches also come from weather changes. Bone is sensitive to atmospheric pressure changes.  HOME CARE INSTRUCTIONS   · Ask when your test results will be ready. Make sure you get your test results.  · Only take over-the-counter or prescription medicines for pain, discomfort, or fever as directed by your caregiver. If you were given medications for your condition, do not drive, operate machinery or power tools, or sign legal documents for 24 hours. Do not drink alcohol. Do not take sleeping pills or other medications that may interfere with treatment.  · Continue all activities unless the activities cause more pain. When the pain lessens, slowly resume normal activities. Gradually increase the intensity and duration of the activities or exercise.  · During periods of severe pain, bed rest may be helpful. Lay or sit in any position that is comfortable.  · Putting ice on the injured area.  · Put ice in a bag.  · Place a towel between your skin and the bag.  · Leave the ice on for 15 to 20 minutes, 3 to 4 times a day.  · Follow up with your caregiver for continued problems and no reason can be found for the pain. If the pain becomes worse or does not go away, it may be necessary to repeat tests or do additional testing. Your caregiver may need to look further for a possible cause.  SEEK IMMEDIATE MEDICAL CARE IF:  · You have pain that is getting worse and is not relieved by medications.  · You develop chest pain  that is associated with shortness or breath, sweating, feeling sick to your stomach (nauseous), or throw up (vomit).  · Your pain becomes localized to the abdomen.  · You develop any new symptoms that seem different or that concern you.  MAKE SURE YOU:   · Understand these instructions.  · Will watch your condition.  · Will get help right away if you are not doing well or get worse.  Document Released: 02/02/2005 Document Revised: 04/27/2011 Document Reviewed: 09/23/2007  [employer]® Patient Information ©2013 [employer].

## 2012-03-30 ENCOUNTER — Emergency Department (HOSPITAL_BASED_OUTPATIENT_CLINIC_OR_DEPARTMENT_OTHER)
Admission: EM | Admit: 2012-03-30 | Discharge: 2012-03-30 | Disposition: A | Discharge status: Home / Self Care | Payer: Self-pay | Attending: Emergency Medicine | Admitting: Emergency Medicine

## 2012-03-30 ENCOUNTER — Emergency Department (HOSPITAL_BASED_OUTPATIENT_CLINIC_OR_DEPARTMENT_OTHER): Payer: Self-pay

## 2012-03-30 ENCOUNTER — Encounter (HOSPITAL_BASED_OUTPATIENT_CLINIC_OR_DEPARTMENT_OTHER): Payer: Self-pay | Admitting: *Deleted

## 2012-03-30 DIAGNOSIS — Z87891 Personal history of nicotine dependence: Secondary | ICD-10-CM | POA: Insufficient documentation

## 2012-03-30 DIAGNOSIS — S335XXA Sprain of ligaments of lumbar spine, initial encounter: Secondary | ICD-10-CM | POA: Insufficient documentation

## 2012-03-30 DIAGNOSIS — S39012A Strain of muscle, fascia and tendon of lower back, initial encounter: Secondary | ICD-10-CM

## 2012-03-30 DIAGNOSIS — Z8679 Personal history of other diseases of the circulatory system: Secondary | ICD-10-CM | POA: Insufficient documentation

## 2012-03-30 DIAGNOSIS — Z7982 Long term (current) use of aspirin: Secondary | ICD-10-CM | POA: Insufficient documentation

## 2012-03-30 DIAGNOSIS — Y9389 Activity, other specified: Secondary | ICD-10-CM | POA: Insufficient documentation

## 2012-03-30 DIAGNOSIS — Y9289 Other specified places as the place of occurrence of the external cause: Secondary | ICD-10-CM | POA: Insufficient documentation

## 2012-03-30 DIAGNOSIS — Y990 Civilian activity done for income or pay: Secondary | ICD-10-CM | POA: Insufficient documentation

## 2012-03-30 DIAGNOSIS — X500XXA Overexertion from strenuous movement or load, initial encounter: Secondary | ICD-10-CM | POA: Insufficient documentation

## 2012-03-30 MED ORDER — HYDROCODONE-ACETAMINOPHEN 5-325 MG PO TABS: 1.0000 | ORAL_TABLET | Freq: Four times a day (QID) | ORAL | Status: DC | PRN

## 2012-03-30 MED ORDER — METHOCARBAMOL 500 MG PO TABS: 500.0000 mg | ORAL_TABLET | Freq: Two times a day (BID) | ORAL | Status: DC

## 2012-03-30 MED ORDER — NAPROXEN 375 MG PO TABS: 375.0000 mg | ORAL_TABLET | Freq: Two times a day (BID) | ORAL | Status: DC

## 2012-03-30 NOTE — ED Notes (Signed)
Pt c/o lower back pain since Saturday after hearing it pop while working out. Pt is able to ambulate.

## 2012-03-30 NOTE — ED Provider Notes (Signed)
History     CSN: [id number]  Arrival date & time 03/30/12  0422   First MD Initiated Contact with Patient 03/30/12 [phone]      Chief Complaint  Patient presents with  . Back Pain    (Consider location/radiation/quality/duration/timing/severity/associated sxs/prior treatment) Patient is a 27 y.o. male presenting with back pain. The history is provided by the patient.  Back Pain Location:  Lumbar spine Quality:  Aching Radiates to:  Does not radiate Pain severity:  Severe Pain is:  Same all the time Onset quality:  Sudden Duration:  5 days Timing:  Constant Progression:  Unchanged Chronicity:  New Context: occupational injury   Context comment:  Round house kick Relieved by:  Nothing Worsened by:  Nothing tried Ineffective treatments:  NSAIDs Associated symptoms: no abdominal pain, no abdominal swelling, no bladder incontinence, no bowel incontinence, no chest pain, no dysuria, no fever, no headaches, no leg pain, no numbness, no paresthesias, no pelvic pain, no perianal numbness, no tingling, no weakness and no weight loss   Risk factors: no hx of cancer     Past Medical History  Diagnosis Date  . Migraine     Past Surgical History  Procedure Laterality Date  . Joint replacement    . Knee surgery      History reviewed. No pertinent family history.  History  Substance Use Topics  . Smoking status: Former Games developer  . Smokeless tobacco: Not on file  . Alcohol Use: No      Review of Systems  Constitutional: Negative for fever and weight loss.  Cardiovascular: Negative for chest pain.  Gastrointestinal: Negative for abdominal pain and bowel incontinence.  Genitourinary: Negative for bladder incontinence, dysuria and pelvic pain.  Musculoskeletal: Positive for back pain.  Neurological: Negative for tingling, weakness, numbness, headaches and paresthesias.  All other systems reviewed and are negative.    Allergies  Review of patient's allergies indicates no  known allergies.  Home Medications   Current Outpatient Rx  Name  Route  Sig  Dispense  Refill  . aspirin (BAYER ASPIRIN) 325 MG tablet   Oral   Take 1 tablet (325 mg total) by mouth daily.   30 tablet   0   . Multiple Vitamin (MULITIVITAMIN WITH MINERALS) TABS   Oral   Take 1 tablet by mouth daily.         . predniSONE (DELTASONE) 10 MG tablet   Oral   Take 2 tablets (20 mg total) by mouth 2 (two) times daily.   20 tablet   0   . EXPIRED: SUMAtriptan (IMITREX) 50 MG tablet   Oral   Take 1 tablet (50 mg total) by mouth every 2 (two) hours as needed for migraine (maximum 200mg  in one day).   20 tablet   0     BP 144/73  Pulse 69  Temp(Src) 98.5 F (36.9 C) (Oral)  Resp 15  Ht 6\' 2"  (1.88 m)  Wt 217 lb (98.431 kg)  BMI 27.85 kg/m2  SpO2 100%  Physical Exam  Constitutional: He is oriented to person, place, and time. He appears well-developed and well-nourished. No distress.  HENT:  Head: Normocephalic and atraumatic.  Mouth/Throat: Oropharynx is clear and moist.  Eyes: Conjunctivae are normal. Pupils are equal, round, and reactive to light.  Neck: Normal range of motion. Neck supple.  Cardiovascular: Normal rate, regular rhythm and intact distal pulses.   Pulmonary/Chest: Effort normal. He has no wheezes. He has no rales.  Abdominal: Soft. Bowel sounds  are normal. There is no tenderness. There is no rebound and no guarding.  Musculoskeletal: Normal range of motion. He exhibits no tenderness.  Neurological: He is alert and oriented to person, place, and time. He has normal reflexes.  No step offs or crepitance over the spine l5/s1 intact intact perineal sensation  Skin: Skin is warm and dry.  Psychiatric: He has a normal mood and affect.    ED Course  Procedures (including critical care time)  Labs Reviewed - No data to display No results found.   No diagnosis found.    MDM  Will treat with pain meds and muscle relaxants         Jenalyn Girdner K  Shamir Sedlar-Rasch, MD 03/30/12 [phone]

## 2012-03-30 NOTE — ED Notes (Signed)
Patient transported to X-ray 

## 2012-07-28 ENCOUNTER — Encounter (HOSPITAL_BASED_OUTPATIENT_CLINIC_OR_DEPARTMENT_OTHER): Payer: Self-pay | Admitting: *Deleted

## 2012-07-28 ENCOUNTER — Emergency Department (HOSPITAL_BASED_OUTPATIENT_CLINIC_OR_DEPARTMENT_OTHER)
Admission: EM | Admit: 2012-07-28 | Discharge: 2012-07-28 | Disposition: A | Discharge status: Home / Self Care | Payer: Self-pay | Attending: Emergency Medicine | Admitting: Emergency Medicine

## 2012-07-28 DIAGNOSIS — IMO0002 Reserved for concepts with insufficient information to code with codable children: Secondary | ICD-10-CM | POA: Insufficient documentation

## 2012-07-28 DIAGNOSIS — R6889 Other general symptoms and signs: Secondary | ICD-10-CM | POA: Insufficient documentation

## 2012-07-28 DIAGNOSIS — L299 Pruritus, unspecified: Secondary | ICD-10-CM | POA: Insufficient documentation

## 2012-07-28 DIAGNOSIS — Z87891 Personal history of nicotine dependence: Secondary | ICD-10-CM | POA: Insufficient documentation

## 2012-07-28 DIAGNOSIS — Z7982 Long term (current) use of aspirin: Secondary | ICD-10-CM | POA: Insufficient documentation

## 2012-07-28 DIAGNOSIS — Z79899 Other long term (current) drug therapy: Secondary | ICD-10-CM | POA: Insufficient documentation

## 2012-07-28 DIAGNOSIS — G43909 Migraine, unspecified, not intractable, without status migrainosus: Secondary | ICD-10-CM | POA: Insufficient documentation

## 2012-07-28 DIAGNOSIS — R21 Rash and other nonspecific skin eruption: Secondary | ICD-10-CM | POA: Insufficient documentation

## 2012-07-28 MED ORDER — DIPHENHYDRAMINE HCL 25 MG PO CAPS
25.0000 mg | ORAL_CAPSULE | Freq: Once | ORAL | Status: AC
Start: 2012-07-28 — End: 2012-07-28
  Administered 2012-07-28: 25 mg via ORAL
  Filled 2012-07-28: qty 1

## 2012-07-28 NOTE — ED Notes (Signed)
States that he started to have a rash on his hands this weekend, now having sore throat. Hands itching

## 2012-07-28 NOTE — ED Provider Notes (Signed)
History     CSN: [id number]  Arrival date & time 07/28/12  1535   First MD Initiated Contact with Patient 07/28/12 1547      Chief Complaint  Patient presents with  . Rash    (Consider location/radiation/quality/duration/timing/severity/associated sxs/prior treatment) HPI  Patient is a 27 yo M presenting to the ED for non-erythematous, non-draining, pruritic rash only present on dorsal portion of his hands that began Sunday. Since then patient has developed itchy throat and ears. Patient denies changes in soaps, lotions, detergents, etc. Denies fevers, chills, throat or facial swelling, SOB, CP.   Past Medical History  Diagnosis Date  . Migraine     Past Surgical History  Procedure Laterality Date  . Joint replacement    . Knee surgery      No family history on file.  History  Substance Use Topics  . Smoking status: Former Games developer  . Smokeless tobacco: Not on file  . Alcohol Use: No      Review of Systems  Constitutional: Negative for fever and chills.  Respiratory: Negative for shortness of breath.   Cardiovascular: Negative for chest pain.  Skin: Positive for rash.    Allergies  Review of patient's allergies indicates no known allergies.  Home Medications   Current Outpatient Rx  Name  Route  Sig  Dispense  Refill  . aspirin (BAYER ASPIRIN) 325 MG tablet   Oral   Take 1 tablet (325 mg total) by mouth daily.   30 tablet   0   . HYDROcodone-acetaminophen (NORCO/VICODIN) 5-325 MG per tablet   Oral   Take 1 tablet by mouth every 6 (six) hours as needed for pain.   6 tablet   0   . methocarbamol (ROBAXIN) 500 MG tablet   Oral   Take 1 tablet (500 mg total) by mouth 2 (two) times daily.   20 tablet   0   . Multiple Vitamin (MULITIVITAMIN WITH MINERALS) TABS   Oral   Take 1 tablet by mouth daily.         . naproxen (NAPROSYN) 375 MG tablet   Oral   Take 1 tablet (375 mg total) by mouth 2 (two) times daily.   20 tablet   0   . predniSONE  (DELTASONE) 10 MG tablet   Oral   Take 2 tablets (20 mg total) by mouth 2 (two) times daily.   20 tablet   0   . EXPIRED: SUMAtriptan (IMITREX) 50 MG tablet   Oral   Take 1 tablet (50 mg total) by mouth every 2 (two) hours as needed for migraine (maximum 200mg  in one day).   20 tablet   0     BP 151/78  Pulse 66  Temp(Src) 97 F (36.1 C) (Oral)  Resp 20  Ht 6\' 2"  (1.88 m)  Wt 216 lb (97.977 kg)  BMI 27.72 kg/m2  SpO2 100%  Physical Exam  Constitutional: He is oriented to person, place, and time. He appears well-developed and well-nourished. No distress.  HENT:  Head: Normocephalic and atraumatic.  Eyes: Conjunctivae are normal.  Neck: Neck supple.  Neurological: He is alert and oriented to person, place, and time.  Skin: Skin is warm and dry. He is not diaphoretic. No erythema. No pallor.  Psychiatric: He has a normal mood and affect.    ED Course  Procedures (including critical care time)  Labs Reviewed - No data to display No results found.   1. Rash and nonspecific skin eruption  MDM  No evidence of SJS or necrotizing fasciitis. Due to pruritic and not painful nature of blisters do not suspect pemphigus vulgaris. Pustules do not resemble scabies as per pt hx or allergic reaction.No blisters, no pustules, no warmth, no draining sinus tracts, no superficial abscesses, no bullous impetigo, no vesicles, no desquamation, no target lesions with dusky purpura or a central bulla. Not tender to touch. Patient advised to use symptomatic care for pruritis. Patient d/w with Dr. Rosalia Hammers, agrees with plan. Patient is stable at time of discharge           Jeannetta Ellis, PA-C 07/30/12 0015

## 2012-07-28 NOTE — ED Notes (Signed)
PA at bedside.

## 2012-08-06 NOTE — ED Provider Notes (Signed)
History/physical exam/procedure(s) were performed by non-physician practitioner and as supervising physician I was immediately available for consultation/collaboration. I have reviewed all notes and am in agreement with care and plan.   Venna Berberich S Debroah Shuttleworth, MD 08/06/12 1704 

## 2012-10-24 ENCOUNTER — Encounter (HOSPITAL_BASED_OUTPATIENT_CLINIC_OR_DEPARTMENT_OTHER): Payer: Self-pay

## 2012-10-24 ENCOUNTER — Emergency Department (HOSPITAL_BASED_OUTPATIENT_CLINIC_OR_DEPARTMENT_OTHER)
Admission: EM | Admit: 2012-10-24 | Discharge: 2012-10-24 | Disposition: A | Discharge status: Home / Self Care | Payer: Self-pay | Attending: Emergency Medicine | Admitting: Emergency Medicine

## 2012-10-24 DIAGNOSIS — G43909 Migraine, unspecified, not intractable, without status migrainosus: Secondary | ICD-10-CM | POA: Insufficient documentation

## 2012-10-24 DIAGNOSIS — Z7982 Long term (current) use of aspirin: Secondary | ICD-10-CM | POA: Insufficient documentation

## 2012-10-24 DIAGNOSIS — J029 Acute pharyngitis, unspecified: Secondary | ICD-10-CM | POA: Insufficient documentation

## 2012-10-24 DIAGNOSIS — R599 Enlarged lymph nodes, unspecified: Secondary | ICD-10-CM | POA: Insufficient documentation

## 2012-10-24 DIAGNOSIS — Z87891 Personal history of nicotine dependence: Secondary | ICD-10-CM | POA: Insufficient documentation

## 2012-10-24 DIAGNOSIS — Z79899 Other long term (current) drug therapy: Secondary | ICD-10-CM | POA: Insufficient documentation

## 2012-10-24 LAB — MONONUCLEOSIS SCREEN: Mono Screen: NEGATIVE

## 2012-10-24 MED ORDER — DOCUSATE SODIUM 50 MG/5ML PO LIQD: 1.0000 mg | Freq: Once | ORAL | Status: DC

## 2012-10-24 NOTE — ED Notes (Signed)
C/o right ear ache, sore throat x 4 days

## 2012-10-24 NOTE — ED Provider Notes (Addendum)
CSN: [id number]     Arrival date & time 10/24/12  1351 History   First MD Initiated Contact with Patient 10/24/12 1449     Chief Complaint  Patient presents with  . Otalgia   (Consider location/radiation/quality/duration/timing/severity/associated sxs/prior Treatment) Patient is a 27 y.o. male presenting with ear pain.  Otalgia  Pt reports 4 days of sore throat moderate, aching worse with swallowing, associated with R sided neck pain/soreness and R ear pain. No fever, no vomiting. No cough. No drainage from ear.   Past Medical History  Diagnosis Date  . Migraine    Past Surgical History  Procedure Laterality Date  . Joint replacement    . Knee surgery     No family history on file. History  Substance Use Topics  . Smoking status: Former Games developer  . Smokeless tobacco: Not on file  . Alcohol Use: No    Review of Systems  HENT: Positive for ear pain.    All other systems reviewed and are negative except as noted in HPI.   Allergies  Review of patient's allergies indicates no known allergies.  Home Medications   Current Outpatient Rx  Name  Route  Sig  Dispense  Refill  . aspirin (BAYER ASPIRIN) 325 MG tablet   Oral   Take 1 tablet (325 mg total) by mouth daily.   30 tablet   0   . HYDROcodone-acetaminophen (NORCO/VICODIN) 5-325 MG per tablet   Oral   Take 1 tablet by mouth every 6 (six) hours as needed for pain.   6 tablet   0   . methocarbamol (ROBAXIN) 500 MG tablet   Oral   Take 1 tablet (500 mg total) by mouth 2 (two) times daily.   20 tablet   0   . Multiple Vitamin (MULITIVITAMIN WITH MINERALS) TABS   Oral   Take 1 tablet by mouth daily.         . naproxen (NAPROSYN) 375 MG tablet   Oral   Take 1 tablet (375 mg total) by mouth 2 (two) times daily.   20 tablet   0   . predniSONE (DELTASONE) 10 MG tablet   Oral   Take 2 tablets (20 mg total) by mouth 2 (two) times daily.   20 tablet   0   . EXPIRED: SUMAtriptan (IMITREX) 50 MG tablet    Oral   Take 1 tablet (50 mg total) by mouth every 2 (two) hours as needed for migraine (maximum 200mg  in one day).   20 tablet   0    BP 134/67  Pulse 89  Temp(Src) 99.5 F (37.5 C) (Oral)  Resp 16  Ht 6\' 2"  (1.88 m)  Wt 220 lb (99.791 kg)  BMI 28.23 kg/m2  SpO2 100% Physical Exam  Nursing note and vitals reviewed. Constitutional: He is oriented to person, place, and time. He appears well-developed and well-nourished.  HENT:  Head: Normocephalic and atraumatic.  Mouth/Throat: No oropharyngeal exudate.  Moderate erythema to posterior pharynx, no tonsillar swelling; TM obscured by cerumen impaction bilaterally  Eyes: EOM are normal. Pupils are equal, round, and reactive to light.  Neck: Normal range of motion. Neck supple.  Cardiovascular: Normal rate, normal heart sounds and intact distal pulses.   Pulmonary/Chest: Effort normal and breath sounds normal.  Abdominal: Bowel sounds are normal. He exhibits no distension. There is no tenderness.  Musculoskeletal: Normal range of motion. He exhibits no edema and no tenderness.  Lymphadenopathy:    He has cervical adenopathy.  Neurological: He is alert and oriented to person, place, and time. He has normal strength. No cranial nerve deficit or sensory deficit.  Skin: Skin is warm and dry. No rash noted.  Psychiatric: He has a normal mood and affect.    ED Course  Procedures (including critical care time) Labs Review Labs Reviewed  RAPID STREP SCREEN  MONONUCLEOSIS SCREEN   Imaging Review No results found.  MDM    Check strep and monospot. Nursing to irrigate ears.   3:47 PM Bilateral cerumen impactions cleared by nurse, TMs normal. Strep neg, awaiting monospot. If neg, likely viral pharyngitis. Advised NSAIDs, salt water gargle, drink plenty of fluid. PCP followup.   Sherena Machorro B. Bernette Mayers, MD 10/24/12 1548  Letasha Kershaw B. Bernette Mayers, MD 10/31/12 2016

## 2012-10-26 LAB — CULTURE, GROUP A STREP

## 2013-04-23 ENCOUNTER — Encounter (HOSPITAL_BASED_OUTPATIENT_CLINIC_OR_DEPARTMENT_OTHER): Payer: Self-pay | Admitting: Emergency Medicine

## 2013-04-23 ENCOUNTER — Emergency Department (HOSPITAL_BASED_OUTPATIENT_CLINIC_OR_DEPARTMENT_OTHER)
Admission: EM | Admit: 2013-04-23 | Discharge: 2013-04-23 | Disposition: A | Discharge status: Home / Self Care | Payer: Self-pay | Attending: Emergency Medicine | Admitting: Emergency Medicine

## 2013-04-23 ENCOUNTER — Emergency Department (HOSPITAL_BASED_OUTPATIENT_CLINIC_OR_DEPARTMENT_OTHER): Payer: Self-pay

## 2013-04-23 DIAGNOSIS — S6390XA Sprain of unspecified part of unspecified wrist and hand, initial encounter: Secondary | ICD-10-CM | POA: Insufficient documentation

## 2013-04-23 DIAGNOSIS — IMO0002 Reserved for concepts with insufficient information to code with codable children: Secondary | ICD-10-CM | POA: Insufficient documentation

## 2013-04-23 DIAGNOSIS — K047 Periapical abscess without sinus: Secondary | ICD-10-CM | POA: Insufficient documentation

## 2013-04-23 DIAGNOSIS — Y9361 Activity, american tackle football: Secondary | ICD-10-CM | POA: Insufficient documentation

## 2013-04-23 DIAGNOSIS — S63619A Unspecified sprain of unspecified finger, initial encounter: Secondary | ICD-10-CM

## 2013-04-23 DIAGNOSIS — Y9239 Other specified sports and athletic area as the place of occurrence of the external cause: Secondary | ICD-10-CM | POA: Insufficient documentation

## 2013-04-23 DIAGNOSIS — K089 Disorder of teeth and supporting structures, unspecified: Secondary | ICD-10-CM | POA: Insufficient documentation

## 2013-04-23 DIAGNOSIS — Z791 Long term (current) use of non-steroidal anti-inflammatories (NSAID): Secondary | ICD-10-CM | POA: Insufficient documentation

## 2013-04-23 DIAGNOSIS — W219XXA Striking against or struck by unspecified sports equipment, initial encounter: Secondary | ICD-10-CM | POA: Insufficient documentation

## 2013-04-23 DIAGNOSIS — G43909 Migraine, unspecified, not intractable, without status migrainosus: Secondary | ICD-10-CM | POA: Insufficient documentation

## 2013-04-23 DIAGNOSIS — Y92838 Other recreation area as the place of occurrence of the external cause: Secondary | ICD-10-CM

## 2013-04-23 DIAGNOSIS — Z87891 Personal history of nicotine dependence: Secondary | ICD-10-CM | POA: Insufficient documentation

## 2013-04-23 DIAGNOSIS — K0889 Other specified disorders of teeth and supporting structures: Secondary | ICD-10-CM

## 2013-04-23 MED ORDER — AMOXICILLIN 500 MG PO CAPS: 500.0000 mg | ORAL_CAPSULE | Freq: Three times a day (TID) | ORAL | Status: DC

## 2013-04-23 MED ORDER — NAPROXEN 500 MG PO TABS: 500.0000 mg | ORAL_TABLET | Freq: Two times a day (BID) | ORAL | Status: DC

## 2013-04-23 MED ORDER — HYDROCODONE-ACETAMINOPHEN 5-325 MG PO TABS: 1.0000 | ORAL_TABLET | Freq: Four times a day (QID) | ORAL | Status: DC | PRN

## 2013-04-23 NOTE — ED Notes (Signed)
Patient brought a splint with him

## 2013-04-23 NOTE — ED Notes (Signed)
Patient was playing football and thinks he broke his right pinky finger.

## 2013-04-23 NOTE — ED Provider Notes (Signed)
CSN: [id number]     Arrival date & time 04/23/13  1542 History   First MD Initiated Contact with Patient 04/23/13 1641     Chief Complaint  Patient presents with  . Finger Injury     (Consider location/radiation/quality/duration/timing/severity/associated sxs/prior Treatment) Patient is a 28 y.o. male presenting with hand injury.  Hand Injury Location:  Finger Injury: yes   Finger location:  R Tilghman finger Pain details:    Quality:  Aching and throbbing   Radiates to:  Does not radiate Chronicity:  New Handedness:  Right-handed Dislocation: no   Foreign body present:  No foreign bodies Prior injury to area:  Yes Relieved by:  Nothing Worsened by:  Movement Ineffective treatments:  Immobilization and ice  Adam Alvarez is a 28 y.o. male who presents to the ED with pain in the right Wojdyla finger that started after he was playing foot ball and injured the finger. He complains of pain, swelling and bruising of the finger. He denies any other injuries.   Past Medical History  Diagnosis Date  . Migraine    Past Surgical History  Procedure Laterality Date  . Joint replacement    . Knee surgery     No family history on file. History  Substance Use Topics  . Smoking status: Former Games developer  . Smokeless tobacco: Not on file  . Alcohol Use: No    Review of Systems Negative except as stated in HPI   Allergies  Review of patient's allergies indicates no known allergies.  Home Medications   Current Outpatient Rx  Name  Route  Sig  Dispense  Refill  . aspirin (BAYER ASPIRIN) 325 MG tablet   Oral   Take 1 tablet (325 mg total) by mouth daily.   30 tablet   0   . HYDROcodone-acetaminophen (NORCO/VICODIN) 5-325 MG per tablet   Oral   Take 1 tablet by mouth every 6 (six) hours as needed for pain.   6 tablet   0   . methocarbamol (ROBAXIN) 500 MG tablet   Oral   Take 1 tablet (500 mg total) by mouth 2 (two) times daily.   20 tablet   0   . Multiple Vitamin  (MULITIVITAMIN WITH MINERALS) TABS   Oral   Take 1 tablet by mouth daily.         . naproxen (NAPROSYN) 375 MG tablet   Oral   Take 1 tablet (375 mg total) by mouth 2 (two) times daily.   20 tablet   0   . predniSONE (DELTASONE) 10 MG tablet   Oral   Take 2 tablets (20 mg total) by mouth 2 (two) times daily.   20 tablet   0   . EXPIRED: SUMAtriptan (IMITREX) 50 MG tablet   Oral   Take 1 tablet (50 mg total) by mouth every 2 (two) hours as needed for migraine (maximum 200mg  in one day).   20 tablet   0    BP 142/83  Pulse 72  Temp(Src) 98.9 F (37.2 C) (Oral)  Resp 18  SpO2 100% Physical Exam  Nursing note and vitals reviewed. Constitutional: He is oriented to person, place, and time. He appears well-developed and well-nourished. No distress.  HENT:  Head: Normocephalic and atraumatic.  Mouth/Throat: Uvula is midline, oropharynx is clear and moist and mucous membranes are normal. Dental abscesses present.    Eyes: Conjunctivae and EOM are normal.  Neck: Neck supple.  Cardiovascular: Normal rate.   Pulmonary/Chest: Effort normal.  Musculoskeletal:       Right hand: He exhibits tenderness and swelling. He exhibits normal capillary refill and no laceration. Normal sensation noted. Normal strength noted.       Hands: Tenderness, swelling and ecchymosis of the right hand  5th digit. Most tenderness is at the DIP. Strong radial pulse.  Neurological: He is alert and oriented to person, place, and time. No cranial nerve deficit.  Skin: Skin is warm and dry.  Psychiatric: He has a normal mood and affect. His behavior is normal.    ED Course  Procedures (including critical care time) Labs Review Labs Reviewed - No data to display Imaging Review Dg Finger Tiedt Right  04/23/2013   CLINICAL DATA:  Injured right Sustaita finger yesterday. Pain is proximal. History of remote fracture.  EXAM: RIGHT [place] FINGER 2+V  COMPARISON:  None.  FINDINGS: No fracture. No dislocation.  Joints are normally space and aligned. There is soft tissue swelling centered at the PIP joint.  IMPRESSION: No fracture or dislocation.   Electronically Signed   By: Amie Portlandavid  Ormond M.D.   On: 04/23/2013 16:29   MDM  28 y.o. male with pain in the right Torgeson finger after injury playing football. No fracture. Splint applied, ice, elevation. Stable for discharge and remains neurovascularly intact. He will follow up with ortho if symptoms persist.  Discussed with the patient and all questioned fully answered.    Medication List    STOP taking these medications       methocarbamol 500 MG tablet  Commonly known as:  ROBAXIN     predniSONE 10 MG tablet  Commonly known as:  DELTASONE      TAKE these medications       amoxicillin 500 MG capsule  Commonly known as:  AMOXIL  Take 1 capsule (500 mg total) by mouth 3 (three) times daily.     HYDROcodone-acetaminophen 5-325 MG per tablet  Commonly known as:  NORCO  Take 1 tablet by mouth every 6 (six) hours as needed for moderate pain.     naproxen 500 MG tablet  Commonly known as:  NAPROSYN  Take 1 tablet (500 mg total) by mouth 2 (two) times daily.      ASK your doctor about these medications       aspirin 325 MG tablet  Commonly known as:  BAYER ASPIRIN  Take 1 tablet (325 mg total) by mouth daily.     multivitamin with minerals Tabs tablet  Take 1 tablet by mouth daily.     SUMAtriptan 50 MG tablet  Commonly known as:  IMITREX  Take 1 tablet (50 mg total) by mouth every 2 (two) hours as needed for migraine (maximum 200mg  in one day).          [place]rovilleHope M Neese, [place]NP 04/24/13 [phone]

## 2013-04-23 NOTE — Discharge Instructions (Signed)
Your x-ray today shows no fracture but your exam shows that you have a bad sprain to the finger. You will need to apply  Ice, elevate and follow up with Dr. Pearletha ForgeHudnall. Return here as needed for worsening symptoms.

## 2013-04-26 NOTE — ED Provider Notes (Signed)
Medical screening examination/treatment/procedure(s) were performed by non-physician practitioner and as supervising physician I was immediately available for consultation/collaboration.   EKG Interpretation None       Shelda JakesScott W. Mosiah Bastin, MD 04/26/13 [phone]

## 2013-10-08 ENCOUNTER — Emergency Department (HOSPITAL_BASED_OUTPATIENT_CLINIC_OR_DEPARTMENT_OTHER)
Admission: EM | Admit: 2013-10-08 | Discharge: 2013-10-08 | Disposition: A | Discharge status: Home / Self Care | Payer: Self-pay | Attending: Emergency Medicine | Admitting: Emergency Medicine

## 2013-10-08 ENCOUNTER — Encounter (HOSPITAL_BASED_OUTPATIENT_CLINIC_OR_DEPARTMENT_OTHER): Payer: Self-pay | Admitting: Emergency Medicine

## 2013-10-08 DIAGNOSIS — Z87891 Personal history of nicotine dependence: Secondary | ICD-10-CM | POA: Insufficient documentation

## 2013-10-08 DIAGNOSIS — G43909 Migraine, unspecified, not intractable, without status migrainosus: Secondary | ICD-10-CM | POA: Insufficient documentation

## 2013-10-08 DIAGNOSIS — Z7982 Long term (current) use of aspirin: Secondary | ICD-10-CM | POA: Insufficient documentation

## 2013-10-08 DIAGNOSIS — Z791 Long term (current) use of non-steroidal anti-inflammatories (NSAID): Secondary | ICD-10-CM | POA: Insufficient documentation

## 2013-10-08 DIAGNOSIS — Z792 Long term (current) use of antibiotics: Secondary | ICD-10-CM | POA: Insufficient documentation

## 2013-10-08 DIAGNOSIS — R61 Generalized hyperhidrosis: Secondary | ICD-10-CM

## 2013-10-08 DIAGNOSIS — Z760 Encounter for issue of repeat prescription: Secondary | ICD-10-CM | POA: Insufficient documentation

## 2013-10-08 DIAGNOSIS — L74519 Primary focal hyperhidrosis, unspecified: Secondary | ICD-10-CM | POA: Insufficient documentation

## 2013-10-08 HISTORY — DX: Generalized hyperhidrosis: R61

## 2013-10-08 MED ORDER — GLYCOPYRROLATE 1 MG PO TABS: 1.0000 mg | ORAL_TABLET | Freq: Three times a day (TID) | ORAL | Status: DC | PRN

## 2013-10-08 MED ORDER — ALUMINUM CHLORIDE 20 % EX SOLN: Freq: Every day | CUTANEOUS | Status: DC

## 2013-10-08 NOTE — Discharge Instructions (Signed)
Take robinul as needed for excessive sweating.  Side effects can include dry mouth, constipation, and difficulty urinating.   Please contact a dermatologist for ongoing care.

## 2013-10-08 NOTE — ED Provider Notes (Signed)
CSN: [id number]     Arrival date & time 10/08/13  1351 History  This chart was scribed for Rolland Porter, MD by Evon Slack, ED Scribe. This patient was seen in room MH03/MH03 and the patient's care was started at 3:16 PM.     Chief Complaint  Patient presents with  . Medication Refill   The history is provided by the patient. No language interpreter was used.   HPI Comments: Adam Alvarez is a 28 y.o. male who presents to the Emergency Department for a medication refill. He states that he was on Drysol. He states that he has been using it and he recently ran out. He states that he has a condition where he sweats excessively. He states that his symptoms present throughout the day at random times. He denies any other related symptoms.    Past Medical History  Diagnosis Date  . Migraine   . Hyperhydrosis disorder    Past Surgical History  Procedure Laterality Date  . Joint replacement    . Knee surgery     No family history on file. History  Substance Use Topics  . Smoking status: Former Games developer  . Smokeless tobacco: Not on file  . Alcohol Use: No    Review of Systems  Constitutional: Negative for fever, chills, diaphoresis, appetite change and fatigue.  HENT: Negative for mouth sores, sore throat and trouble swallowing.   Eyes: Negative for visual disturbance.  Respiratory: Negative for cough, chest tightness, shortness of breath and wheezing.   Cardiovascular: Negative for chest pain.  Gastrointestinal: Negative for nausea, vomiting, abdominal pain, diarrhea and abdominal distention.  Endocrine: Negative for polydipsia, polyphagia and polyuria.  Genitourinary: Negative for dysuria, frequency and hematuria.  Musculoskeletal: Negative for gait problem.  Skin: Negative for color change, pallor and rash.  Neurological: Negative for dizziness, syncope, light-headedness and headaches.  Hematological: Does not bruise/bleed easily.  Psychiatric/Behavioral: Negative for  behavioral problems and confusion.      Allergies  Review of patient's allergies indicates no known allergies.  Home Medications   Prior to Admission medications   Medication Sig Start Date End Date Taking? Authorizing Provider  aspirin (BAYER ASPIRIN) 325 MG tablet Take 1 tablet (325 mg total) by mouth daily. 01/04/12  Yes Charles B. Bernette Mayers, MD  Multiple Vitamin (MULITIVITAMIN WITH MINERALS) TABS Take 1 tablet by mouth daily.   Yes Historical Provider, MD  amoxicillin (AMOXIL) 500 MG capsule Take 1 capsule (500 mg total) by mouth 3 (three) times daily. 04/23/13   Hope Orlene Och, NP  glycopyrrolate (ROBINUL) 1 MG tablet Take 1 tablet (1 mg total) by mouth 3 (three) times daily as needed (for excessive sweating). 10/08/13   Rolland Porter, MD  HYDROcodone-acetaminophen (NORCO) 5-325 MG per tablet Take 1 tablet by mouth every 6 (six) hours as needed for moderate pain. 04/23/13   Hope Orlene Och, NP  naproxen (NAPROSYN) 500 MG tablet Take 1 tablet (500 mg total) by mouth 2 (two) times daily. 04/23/13   Hope Orlene Och, NP  SUMAtriptan (IMITREX) 50 MG tablet Take 1 tablet (50 mg total) by mouth every 2 (two) hours as needed for migraine (maximum  in one day). 10/30/10 10/30/11  Vida Roller, MD   Triage Vitals: BP 135/70  Pulse 62  Temp(Src) 98.2 F (36.8 C) (Oral)  Resp 18  Ht 6' (1.829 m)  Wt 237 lb 3.2 oz (107.593 kg)  BMI 32.16 kg/m2  SpO2 100%  Physical Exam  Constitutional: He is oriented to person,  place, and time. He appears well-developed and well-nourished. No distress.  HENT:  Head: Normocephalic.  Eyes: Conjunctivae are normal. Pupils are equal, round, and reactive to light. No scleral icterus.  Neck: Normal range of motion. Neck supple. No thyromegaly present.  Cardiovascular: Normal rate and regular rhythm.  Exam reveals no gallop and no friction rub.   No murmur heard. Pulmonary/Chest: Effort normal and breath sounds normal. No respiratory distress. He has no wheezes. He has no  rales.  Abdominal: Soft. Bowel sounds are normal. He exhibits no distension. There is no tenderness. There is no rebound.  Musculoskeletal: Normal range of motion.  Neurological: He is alert and oriented to person, place, and time.  Skin: Skin is warm and dry. No rash noted.  Psychiatric: He has a normal mood and affect. His behavior is normal.    ED Course  Procedures (including critical care time) DIAGNOSTIC STUDIES: Oxygen Saturation is 100% on RA, normal by my interpretation.    COORDINATION OF CARE:    Labs Review Labs Reviewed - No data to display  Imaging Review No results found.   EKG Interpretation None      MDM   Final diagnoses:  Hyperhydrosis disorder         I personally performed the services described in this documentation, which was scribed in my presence. The recorded information has been reviewed and is accurate.      Rolland Porter, MD 10/08/13 [phone]

## 2013-10-08 NOTE — ED Notes (Signed)
Pt here to get a refill for a medication for hyperhydrosis.  Pt states that he got original prescription at [facility] regional

## 2014-05-19 ENCOUNTER — Encounter (HOSPITAL_BASED_OUTPATIENT_CLINIC_OR_DEPARTMENT_OTHER): Payer: Self-pay | Admitting: Emergency Medicine

## 2014-05-19 ENCOUNTER — Emergency Department (HOSPITAL_BASED_OUTPATIENT_CLINIC_OR_DEPARTMENT_OTHER)
Admission: EM | Admit: 2014-05-19 | Discharge: 2014-05-19 | Disposition: A | Discharge status: Home / Self Care | Payer: Self-pay | Attending: Emergency Medicine | Admitting: Emergency Medicine

## 2014-05-19 DIAGNOSIS — G43909 Migraine, unspecified, not intractable, without status migrainosus: Secondary | ICD-10-CM | POA: Insufficient documentation

## 2014-05-19 DIAGNOSIS — Z7982 Long term (current) use of aspirin: Secondary | ICD-10-CM | POA: Insufficient documentation

## 2014-05-19 DIAGNOSIS — Y9289 Other specified places as the place of occurrence of the external cause: Secondary | ICD-10-CM | POA: Insufficient documentation

## 2014-05-19 DIAGNOSIS — W25XXXA Contact with sharp glass, initial encounter: Secondary | ICD-10-CM | POA: Insufficient documentation

## 2014-05-19 DIAGNOSIS — Z87891 Personal history of nicotine dependence: Secondary | ICD-10-CM | POA: Insufficient documentation

## 2014-05-19 DIAGNOSIS — Z79899 Other long term (current) drug therapy: Secondary | ICD-10-CM | POA: Insufficient documentation

## 2014-05-19 DIAGNOSIS — Y998 Other external cause status: Secondary | ICD-10-CM | POA: Insufficient documentation

## 2014-05-19 DIAGNOSIS — Y9389 Activity, other specified: Secondary | ICD-10-CM | POA: Insufficient documentation

## 2014-05-19 DIAGNOSIS — S51812A Laceration without foreign body of left forearm, initial encounter: Secondary | ICD-10-CM | POA: Insufficient documentation

## 2014-05-19 DIAGNOSIS — Z872 Personal history of diseases of the skin and subcutaneous tissue: Secondary | ICD-10-CM | POA: Insufficient documentation

## 2014-05-19 MED ORDER — LIDOCAINE HCL 2 % IJ SOLN
20.0000 mL | Freq: Once | INTRAMUSCULAR | Status: AC
  Administered 2014-05-19: 400 mg
  Filled 2014-05-19: qty 20

## 2014-05-19 NOTE — Discharge Instructions (Signed)

## 2014-05-19 NOTE — ED Notes (Signed)
29 yo male moving a glass table when it lacerated his left lower arm. Bleeding controlled. Open and unapproximated.

## 2014-05-19 NOTE — ED Provider Notes (Signed)
CSN: [id number]     Arrival date & time 05/19/14  [id number] History   First MD Initiated Contact with Patient 05/19/14 [phone]     Chief Complaint  Patient presents with  . Extremity Laceration     (Consider location/radiation/quality/duration/timing/severity/associated sxs/prior Treatment) Patient is a 29 y.o. male presenting with skin laceration. The history is provided by the patient.  Laceration Location:  Shoulder/arm Shoulder/arm laceration location:  L forearm Length (cm):  3 Depth:  Cutaneous Quality: straight   Bleeding: controlled   Time since incident:  8 hours Laceration mechanism:  Broken glass Pain details:    Severity:  No pain   Timing:  Constant Foreign body present:  No foreign bodies Relieved by:  Nothing Worsened by:  Nothing tried Tetanus status:  Out of date   Past Medical History  Diagnosis Date  . Migraine   . Hyperhydrosis disorder    Past Surgical History  Procedure Laterality Date  . Joint replacement    . Knee surgery     No family history on file. History  Substance Use Topics  . Smoking status: Former Games developermoker  . Smokeless tobacco: Not on file  . Alcohol Use: No    Review of Systems  Constitutional: Negative for fever and chills.  Respiratory: Negative for cough and shortness of breath.   All other systems reviewed and are negative.     Allergies  Review of patient's allergies indicates no known allergies.  Home Medications   Prior to Admission medications   Medication Sig Start Date End Date Taking? Authorizing Provider  aluminum chloride (DRYSOL) 20 % external solution Apply topically at bedtime. 10/08/13   Rolland PorterMark James, MD  amoxicillin (AMOXIL) 500 MG capsule Take 1 capsule (500 mg total) by mouth 3 (three) times daily. 04/23/13   Hope Orlene OchM Neese, NP  aspirin (BAYER ASPIRIN) 325 MG tablet Take 1 tablet (325 mg total) by mouth daily. 01/04/12   Susy Frizzleharles Sheldon, MD  glycopyrrolate (ROBINUL) 1 MG tablet Take 1 tablet (1 mg total) by mouth 3  (three) times daily as needed (for excessive sweating). 10/08/13   Rolland PorterMark James, MD  HYDROcodone-acetaminophen (NORCO) 5-325 MG per tablet Take 1 tablet by mouth every 6 (six) hours as needed for moderate pain. 04/23/13   Hope Orlene OchM Neese, NP  Multiple Vitamin (MULITIVITAMIN WITH MINERALS) TABS Take 1 tablet by mouth daily.    Historical Provider, MD  naproxen (NAPROSYN) 500 MG tablet Take 1 tablet (500 mg total) by mouth 2 (two) times daily. 04/23/13   Hope Orlene OchM Neese, NP  SUMAtriptan (IMITREX) 50 MG tablet Take 1 tablet (50 mg total) by mouth every 2 (two) hours as needed for migraine (maximum 200mg  in one day). 10/30/10 10/30/11  Eber HongBrian Miller, MD   BP 120/72 mmHg  Pulse 70  Temp(Src) 98 F (36.7 C) (Oral)  Resp 18  Ht 6\' 2"  (1.88 m)  Wt 239 lb (108.41 kg)  BMI 30.67 kg/m2  SpO2 100% Physical Exam  Constitutional: He is oriented to person, place, and time. He appears well-developed and well-nourished. No distress.  HENT:  Head: Normocephalic and atraumatic.  Mouth/Throat: No oropharyngeal exudate.  Eyes: EOM are normal. Pupils are equal, round, and reactive to light.  Neck: Normal range of motion. Neck supple.  Cardiovascular: Normal rate and regular rhythm.  Exam reveals no friction rub.   No murmur heard. Pulmonary/Chest: Effort normal and breath sounds normal. No respiratory distress. He has no wheezes. He has no rales.  Abdominal: Soft. He exhibits no  distension. There is no tenderness. There is no rebound.  Musculoskeletal: Normal range of motion. He exhibits no edema.       Arms: Neurological: He is alert and oriented to person, place, and time.  Skin: He is not diaphoretic. No erythema.  Nursing note and vitals reviewed.   ED Course  LACERATION REPAIR Date/Time: 05/19/2014 9:05 AM Performed by: Elwin Mocha Authorized by: Elwin Mocha Consent: Verbal consent obtained. Body area: upper extremity Location details: left lower arm Laceration length: 4 cm Foreign bodies: no foreign  bodies Tendon involvement: none Nerve involvement: none Vascular damage: no Anesthesia: local infiltration Local anesthetic: lidocaine 2% without epinephrine Anesthetic total: 3 ml Patient sedated: no Preparation: Patient was prepped and draped in the usual sterile fashion. Irrigation solution: saline Irrigation method: jet lavage Amount of cleaning: standard Skin closure: 4-0 Prolene Number of sutures: 12 Technique: simple Approximation: close Approximation difficulty: simple Dressing: 4x4 sterile gauze Patient tolerance: Patient tolerated the procedure well with no immediate complications   (including critical care time) Labs Review Labs Reviewed - No data to display  Imaging Review No results found.   EKG Interpretation None      MDM   Final diagnoses:  Forearm laceration, left, initial encounter    52M here with L forearm lac. Cut on broken glass last night. No foreign bodies. 4 cm superficial laceration to L forearm. Refused tetanus update - against his religion. Repair as above. Stable for discharge.    Elwin Mocha, MD 05/19/14 [phone]

## 2014-05-19 NOTE — ED Notes (Signed)
MD at bedside. 

## 2014-09-06 ENCOUNTER — Emergency Department (HOSPITAL_BASED_OUTPATIENT_CLINIC_OR_DEPARTMENT_OTHER)
Admission: EM | Admit: 2014-09-06 | Discharge: 2014-09-06 | Disposition: A | Discharge status: Home / Self Care | Payer: Self-pay | Attending: Emergency Medicine | Admitting: Emergency Medicine

## 2014-09-06 ENCOUNTER — Encounter (HOSPITAL_BASED_OUTPATIENT_CLINIC_OR_DEPARTMENT_OTHER): Payer: Self-pay

## 2014-09-06 DIAGNOSIS — R61 Generalized hyperhidrosis: Secondary | ICD-10-CM | POA: Insufficient documentation

## 2014-09-06 DIAGNOSIS — Z760 Encounter for issue of repeat prescription: Secondary | ICD-10-CM | POA: Insufficient documentation

## 2014-09-06 DIAGNOSIS — Z87891 Personal history of nicotine dependence: Secondary | ICD-10-CM | POA: Insufficient documentation

## 2014-09-06 DIAGNOSIS — G43909 Migraine, unspecified, not intractable, without status migrainosus: Secondary | ICD-10-CM | POA: Insufficient documentation

## 2014-09-06 MED ORDER — ALUMINUM CHLORIDE 20 % EX SOLN: Freq: Every day | CUTANEOUS | Status: DC

## 2014-09-06 NOTE — Discharge Instructions (Signed)
Diaphoresis Sweating is controlled by our nervous system. Sweat glands are found in the skin throughout the body. They exist in higher numbers in the skin of the hands, feet, armpits, and the genital region. Sweating occurs normally when the temperature of your body goes up. Diaphoresis means profuse sweating or perspiration due to an underlying medical condition or an external factor (such as medicines). Hyperhidrosis means excessive sweating that is not usually due to an underlying medical condition, on areas such as the palms, soles, or armpits. Other areas of the body may also be affected. Hyperhidrosis usually begins in childhood or early adolescence. It increases in severity through puberty and into adulthood. Sweaty palms are the most common problem and the most bothersome to people who have hyperhidrosis. CAUSES  Sweating is normally seen with exercise or being in hot surroundings. Not sweating in these conditions would be harmful. Stressful situations can also cause sweating. In some people, stimulation of the sweat glands during stress is overactive. Talking to strangers or shaking someone's hand can produce profuse sweating. Causes of sweating include:  Emotional upset.  Low blood pressure.  Low blood sugar.  Heart problems.  Low blood cell counts.  Certain pain relieving medicines.  Exercise.  Alcohol.  Infection.  Caffeine.  Spicy foods.  Hot flashes.  Overactive thyroid.  Illegal drug use, such as cocaine and amphetamine.  Use of medicines that stimulate parts of the nervous system.  A tumor (pheochromocytoma).  Withdrawal from some medicines or alcohol. DIAGNOSIS  Your caregiver needs to be consulted to make sure excessive sweating is not caused by another condition. Further testing may need to be done. TREATMENT   When hyperhidrosis is caused by another condition, that condition should be treated.  If menopause is the cause, you may wish to talk to your  caregiver about estrogen replacement.  If the hyperhidrosis is a natural happening of the way your body works, certain antiperspirants may help.  If medicines do not work, injections of botulinum toxin type A are sometimes used for underarm sweating.  Your caregiver can usually help you with this problem. Document Released: 08/25/2004 Document Revised: 04/27/2011 Document Reviewed: 03/12/2008 ExitCare Patient Information 2015 [employer]. This information is not intended to replace advice given to you by your health care provider. Make sure you discuss any questions you have with your health care provider.  

## 2014-09-06 NOTE — ED Notes (Signed)
Requesting rx for "drysol"

## 2014-09-18 NOTE — ED Provider Notes (Signed)
CSN: [id number]     Arrival date & time 09/06/14  1150 History   First MD Initiated Contact with Patient 09/06/14 1201     Chief Complaint  Patient presents with  . Medication Refill     (Consider location/radiation/quality/duration/timing/severity/associated sxs/prior Treatment) HPI   29yM requesting refill of "drysol." just ran out. Takes for hx of hyperhydrosis. No other complaints.   Past Medical History  Diagnosis Date  . Migraine   . Hyperhydrosis disorder    Past Surgical History  Procedure Laterality Date  . Joint replacement    . Knee surgery     No family history on file. History  Substance Use Topics  . Smoking status: Former Games developer  . Smokeless tobacco: Not on file  . Alcohol Use: No    Review of Systems  All systems reviewed and negative, other than as noted in HPI.   Allergies  Review of patient's allergies indicates no known allergies.  Home Medications   Prior to Admission medications   Medication Sig Start Date End Date Taking? Authorizing Provider  aluminum chloride (DRYSOL) 20 % external solution Apply topically at bedtime. 09/06/14   Raeford Razor, MD  SUMAtriptan (IMITREX) 50 MG tablet Take 1 tablet (50 mg total) by mouth every 2 (two) hours as needed for migraine (maximum  in one day). 10/30/10 10/30/11  Eber Hong, MD   BP 121/77 mmHg  Pulse 73  Temp(Src) 98.2 F (36.8 C) (Oral)  Resp 16  Ht  (1.905 m)  Wt 246 lb (111.585 kg)  BMI 30.75 kg/m2  SpO2 100% Physical Exam  Constitutional: He appears well-developed and well-nourished. No distress.  HENT:  Head: Normocephalic and atraumatic.  Eyes: Conjunctivae are normal. Right eye exhibits no discharge. Left eye exhibits no discharge.  Neck: Neck supple.  Cardiovascular: Normal rate, regular rhythm and normal heart sounds.  Exam reveals no gallop and no friction rub.   No murmur heard. Pulmonary/Chest: Effort normal and breath sounds normal. No respiratory distress.   Abdominal: Soft. He exhibits no distension. There is no tenderness.  Musculoskeletal: He exhibits no edema or tenderness.  Neurological: He is alert.  Skin: Skin is warm and dry.  Psychiatric: He has a normal mood and affect. His behavior is normal. Thought content normal.  Nursing note and vitals reviewed.   ED Course  Procedures (including critical care time) Labs Review Labs Reviewed - No data to display  Imaging Review No results found.   EKG Interpretation None      MDM   Final diagnoses:  Medication refill  Hyperhydrosis disorder   29yM requesting drysol which he takes for hyperhydrosis. No other complaints.     Raeford Razor, MD 09/18/14 [phone]

## 2015-08-01 ENCOUNTER — Emergency Department (HOSPITAL_BASED_OUTPATIENT_CLINIC_OR_DEPARTMENT_OTHER)
Admission: EM | Admit: 2015-08-01 | Discharge: 2015-08-01 | Disposition: A | Discharge status: Home / Self Care | Payer: Managed Care, Other (non HMO) | Attending: Emergency Medicine | Admitting: Emergency Medicine

## 2015-08-01 ENCOUNTER — Encounter (HOSPITAL_BASED_OUTPATIENT_CLINIC_OR_DEPARTMENT_OTHER): Payer: Self-pay | Admitting: *Deleted

## 2015-08-01 DIAGNOSIS — Z87891 Personal history of nicotine dependence: Secondary | ICD-10-CM | POA: Insufficient documentation

## 2015-08-01 DIAGNOSIS — R61 Generalized hyperhidrosis: Secondary | ICD-10-CM | POA: Insufficient documentation

## 2015-08-01 MED ORDER — OXYBUTYNIN CHLORIDE 5 MG PO TABS: 5.0000 mg | ORAL_TABLET | Freq: Two times a day (BID) | ORAL | Status: DC

## 2015-08-01 MED FILL — OXYBUTYNIN 5 MG TABLET: 5 | 30 days supply | Qty: 60 | Fill #0

## 2015-08-01 NOTE — Discharge Instructions (Signed)
If you have any trouble urinating stop meds immediately.  Stay well hydrated as meds will cause dry mouth Hyperhidrosis It is normal to sweat when you are hot, being physically active, or feeling anxious. Sweating is a necessary function for your body. However, hyperhidrosis is when you sweat too much (excessively). Although hyperhidrosis is not dangerous, it can make you feel embarrassed.  There are two kinds of hyperhidrosis:  Primary hyperhidrosis. The sweating usually localizes in one part of your body, such as your underarms, or in a few areas, such as your feet, face, armpits, and hands. This is the more common kind of hyperhidrosis.  Secondary hyperhidrosis. This type more likely affects your entire body. CAUSES The cause of your hyperhidrosis depends on the kind you have.  Primary hyperhidrosis may be caused by having sweat glands that are more active than normal.  Secondary hyperhidrosis is caused by an underlying condition. Possible conditions include:  Diabetes.  Gout.  Certain medicines.  Anxiety.  Stroke.  Obesity.  Menopause.  Overactive thyroid (hyperthyroidism).  Tumors.  Frostbite.  Certain types of cancers.  Alcoholism.  Injury to your nervous system.  Stroke.  Parkinson disease. RISK FACTORS You may be at an increased risk for primary hyperhidrosis if you have a family history of it. SIGNS AND SYMPTOMS General symptoms of hyperhidrosis may include:  Feeling like you are sweating constantly, even while you are resting.  Having skin that peels or gets paler or softer in the areas where you sweat the most.  Being able to see sweat on your skin. Symptoms of primary hyperhidrosis may include:  Sweating in specific areas, such as your armpits, palms, feet, and face.  Sweating in the same location on both sides of your body.  Sweating only during the day. Symptoms of secondary hyperhidrosis may include:  Sweating all over your  body.  Sweating even while you sleep. DIAGNOSIS  Hyperhidrosis may be diagnosed by:  Medical history and physical exam.  Testing, such as:  Sweat test.  Paper test. TREATMENT Your treatment will depend on the kind of hyperhidrosis you have and the parts of your body that are affected. If your hyperhidrosis is caused by an underlying condition, your treatment will address the cause. Treatment may include:  Strong antiperspirants. Your health care provider may give you a prescription.  Medicines taken by mouth.  Medicines injected by your health care provider. These may include small amounts of botulinum toxin.  Iontophoresis. This is a procedure that temporarily turns off the sweat glands in your hands and feet.  Surgery to remove your sweat glands.  Sympathectomy. This is a procedure that cuts or destroys your nerves so that they do not send a signal to sweat. HOME CARE INSTRUCTIONS  Take medicines only as directed by your health care provider.  Use antiperspirants as directed by your health care provider.  Limit or avoid foods or beverages that seem to increase your chances of sweating, such as:  Spicy food.  Caffeine.  Alcohol.  Foods that contain MSG.  If your feet sweat:  Wear sandals, when possible.  Do not wear cotton socks. Wear socks that remove or wick moisture from your feet.  Wear leather shoes.  Avoid wearing the same pair of shoes two days in a row.  Consider joining a hyperhidrosis support group. SEEK MEDICAL CARE IF:   You have new symptoms.  Your symptoms get worse.   This information is not intended to replace advice given to you by your health  care provider. Make sure you discuss any questions you have with your health care provider.   Document Released: 04/03/2005 Document Revised: 02/23/2014 Document Reviewed: 09/12/2013 Elsevier Interactive Patient Education [employer]

## 2015-08-01 NOTE — ED Notes (Signed)
States he has Hyperhydrosis and is here to see if he can get a Rx for medication.

## 2015-08-01 NOTE — ED Provider Notes (Addendum)
CSN: [id number]     Arrival date & time 08/01/15  1357 History   First MD Initiated Contact with Patient 08/01/15 1405     Chief Complaint  Patient presents with  . Excessive Sweating     (Consider location/radiation/quality/duration/timing/severity/associated sxs/prior Treatment) HPI Comments: Patient is a healthy 30 year old male presenting today with request for treatment for his hyper hyperhidrosis. He has tried multiple topical preparations without improvement of symptoms.  He denies any fevers, chills, infectious symptoms. This has been ongoing for years. He does not take any medications at home.  He is requesting oxybutinin due looking up research and it being shown to help.  The history is provided by the patient.    Past Medical History  Diagnosis Date  . Migraine   . Hyperhydrosis disorder    Past Surgical History  Procedure Laterality Date  . Joint replacement    . Knee surgery     No family history on file. Social History  Substance Use Topics  . Smoking status: Former Games developermoker  . Smokeless tobacco: None  . Alcohol Use: No    Review of Systems  All other systems reviewed and are negative.     Allergies  Review of patient's allergies indicates no known allergies.  Home Medications   Prior to Admission medications   Medication Sig Start Date End Date Taking? Authorizing Provider  aluminum chloride (DRYSOL) 20 % external solution Apply topically at bedtime. 09/06/14   Raeford RazorStephen Kohut, MD  SUMAtriptan (IMITREX) 50 MG tablet Take 1 tablet (50 mg total) by mouth every 2 (two) hours as needed for migraine (maximum 200mg  in one day). 10/30/10 10/30/11  Eber HongBrian Miller, MD   BP 121/86 mmHg  Pulse 88  Temp(Src) 98.8 F (37.1 C) (Oral)  Resp 20  Ht 6\' 3"  (1.905 m)  Wt 221 lb (100.245 kg)  BMI 27.62 kg/m2  SpO2 100% Physical Exam  Constitutional: He is oriented to person, place, and time. He appears well-developed and well-nourished. No distress.  HENT:  Head:  Normocephalic and atraumatic.  Mouth/Throat: Oropharynx is clear and moist.  Eyes: Conjunctivae and EOM are normal. Pupils are equal, round, and reactive to light.  Neck: Normal range of motion. Neck supple.  Cardiovascular: Normal rate, regular rhythm and intact distal pulses.   No murmur heard. Pulmonary/Chest: Effort normal and breath sounds normal. No respiratory distress. He has no wheezes. He has no rales.  Musculoskeletal: Normal range of motion. He exhibits no edema or tenderness.  Neurological: He is alert and oriented to person, place, and time.  Skin: Skin is warm and dry. No rash noted. No erythema.  Psychiatric: He has a normal mood and affect. His behavior is normal.  Nursing note and vitals reviewed.   ED Course  Procedures (including critical care time) Labs Review Labs Reviewed - No data to display  Imaging Review No results found. I have personally reviewed and evaluated these images and lab results as part of my medical decision-making.   EKG Interpretation None      MDM   Final diagnoses:  Hyperhidrosis    Pt with hx of hyperhidrosis presenting with request for prescription.  Pt denies any medical problems.  Takes no meds regularly But has taken multiple topical preparations without improvement of his symptoms and is requesting oxybutynin for the hyperhidrosis.   He has no further complaints at this time, normal exam and vital signs.     Gwyneth SproutWhitney Lateia Fraser, MD 08/01/15 1438  Gwyneth SproutWhitney Ben Habermann, MD 08/01/15 1440

## 2017-01-02 ENCOUNTER — Encounter: Payer: Self-pay | Admitting: Physician Assistant

## 2017-01-02 ENCOUNTER — Ambulatory Visit (INDEPENDENT_AMBULATORY_CARE_PROVIDER_SITE_OTHER): Payer: BLUE CROSS/BLUE SHIELD | Admitting: Physician Assistant

## 2017-01-02 ENCOUNTER — Telehealth: Payer: Self-pay | Admitting: *Deleted

## 2017-01-02 ENCOUNTER — Other Ambulatory Visit: Payer: Self-pay

## 2017-01-02 VITALS — BP 140/100 | HR 86 | Temp 99.0°F | Resp 16 | Ht 74.0 in | Wt 256.2 lb

## 2017-01-02 DIAGNOSIS — L749 Eccrine sweat disorder, unspecified: Secondary | ICD-10-CM

## 2017-01-02 MED ORDER — OXYBUTYNIN CHLORIDE 5 MG PO TABS: 5.0000 mg | ORAL_TABLET | Freq: Three times a day (TID) | ORAL | 1 refills | Status: DC

## 2017-01-02 NOTE — Progress Notes (Signed)
PR[facility] CARE AT [facility] [address], [place] [phone]  Date:  01/02/2017   Name:  Adam Alvarez   DOB:  08/09/1985   MRN:  [id number]  PCP:  Patient, No Pcp Per    History of Present Illness:  Adam Alvarez is a 31 y.o. male patient who presents to PCP with  Chief Complaint  Patient presents with  . estab care  . Medication Refill    Oxbutynin CHL (Ditropan) 5 mg   He is here to have refill of the oxybutynin.  He has taken this for more than a year for hyperhidrosis.  This, he states is the only thing that works for him.  He reports initial side effect of difficulty with urination but this had resolved.  He notes no abdominal pain or constipation with the medication at this time.   He has history of migraines.  He is not taking imitrex due to side effects.  He notes that he is taking another migraine medicine, though he can not report what that is at this time.    He is a Naval architecttruck driver.  He is concerned of his poor diet due to being on the road, but states this is hard to break.  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    Married.    There are no active problems to display for this patient.   Past Medical History:  Diagnosis Date  . Hyperhydrosis disorder   . Migraine     Past Surgical History:  Procedure Laterality Date  . JOINT REPLACEMENT    . KNEE SURGERY      Social History   Tobacco Use  . Smoking status: Former Games developermoker  . Smokeless tobacco: Never Used  Substance Use Topics  . Alcohol use: No  . Drug use: No    No family history on file.  No Known Allergies  Medication list has been reviewed and updated.  Current Outpatient Medications on File Prior to Visit  Medication Sig Dispense Refill  . oxybutynin (DITROPAN) 5 MG tablet Take 1 tablet (5 mg total) by mouth 2 (two) times daily. 60 tablet 0  . aluminum chloride (DRYSOL) 20 % external solution Apply topically at bedtime. (Patient  not taking: Reported on 01/02/2017) 60 mL 6  . SUMAtriptan (IMITREX) 50 MG tablet Take 1 tablet (50 mg total) by mouth every 2 (two) hours as needed for migraine (maximum 200mg  in one day). 20 tablet 0   No current facility-administered medications on file prior to visit.     ROS ROS otherwise unremarkable unless listed above.  Physical Examination: BP (!) 130/102   Pulse 86   Temp 99 F (37.2 C) (Oral)   Resp 16   Ht 6\' 2"  (1.88 m)   Wt 256 lb 3.2 oz (116.2 kg)   SpO2 98%   BMI 32.89 kg/m  Ideal Body Weight: Weight in (lb) to have BMI = 25: 194.3  Physical Exam  Constitutional: He is oriented to person, place, and time. He appears well-developed and well-nourished. No distress.  HENT:  Head: Normocephalic and atraumatic.  Eyes: Conjunctivae and EOM are normal. Pupils are equal, round, and reactive to light.  Cardiovascular: Normal rate.  Pulmonary/Chest: Effort normal. No respiratory distress.  Neurological: He is alert and oriented to person, place, and time.  Skin: Skin is warm and dry. He is not diaphoretic.  Psychiatric: He has a normal mood and affect. His  behavior is normal.     Assessment and Plan: Adam Alvarez is a 31 y.o. male who is here today for refill. He will return within 2 weeks, if blood pressure is not under 140/90.  Wife is nurse at home.  Otherwise, he shall return within 3 months for physical exam, where other labs can be obtained.  He has not slept over 24 hours, and may contribute to today's bp. Sweating abnormality - Plan: oxybutynin (DITROPAN) 5 MG tablet  Trena PlattStephanie Joshuajames Moehring, PA-C [facility] 11/24/20189:00 AM

## 2017-01-02 NOTE — Patient Instructions (Addendum)
Hyperhidrosis  It is normal to sweat when you are hot, being physically active, or feeling anxious. Sweating is a necessary function for your body. However, hyperhidrosis is when you sweat too much (excessively). Although hyperhidrosis is not dangerous, it can make you feel embarrassed.  There are two kinds of hyperhidrosis:   Primary hyperhidrosis. The sweating usually localizes in one part of your body, such as your underarms, or in a few areas, such as your feet, face, armpits, and hands. This is the more common kind of hyperhidrosis.   Secondary hyperhidrosis. This type more likely affects your entire body.    What are the causes?  The cause of your hyperhidrosis depends on the kind you have.   Primary hyperhidrosis may be caused by having sweat glands that are more active than normal.   Secondary hyperhidrosis is caused by an underlying condition. Possible conditions include:  ? Diabetes.  ? Gout.  ? Certain medicines.  ? Anxiety.  ? Stroke.  ? Obesity.  ? Menopause.  ? Overactive thyroid (hyperthyroidism).  ? Tumors.  ? Frostbite.  ? Certain types of cancers.  ? Alcoholism.  ? Injury to your nervous system.  ? Stroke.  ? Parkinson disease.    What increases the risk?  You may be at an increased risk for primary hyperhidrosis if you have a family history of it.  What are the signs or symptoms?  General symptoms of hyperhidrosis may include:   Feeling like you are sweating constantly, even while you are resting.   Having skin that peels or gets paler or softer in the areas where you sweat the most.   Being able to see sweat on your skin.    Symptoms of primary hyperhidrosis may include:   Sweating in specific areas, such as your armpits, palms, feet, and face.   Sweating in the same location on both sides of your body.   Sweating only during the day.    Symptoms of secondary hyperhidrosis may include:   Sweating all over your body.   Sweating even while you sleep.    How is this  diagnosed?  Hyperhidrosis may be diagnosed by:   Medical history and physical exam.   Testing, such as:  ? Sweat test.  ? Paper test.    How is this treated?  Your treatment will depend on the kind of hyperhidrosis you have and the parts of your body that are affected. If your hyperhidrosis is caused by an underlying condition, your treatment will address the cause. Treatment may include:   Strong antiperspirants. Your health care provider may give you a prescription.   Medicines taken by mouth.   Medicines injected by your health care provider. These may include small amounts of botulinum toxin.   Iontophoresis. This is a procedure that temporarily turns off the sweat glands in your hands and feet.   Surgery to remove your sweat glands.   Sympathectomy. This is a procedure that cuts or destroys your nerves so that they do not send a signal to sweat.    Follow these instructions at home:   Take medicines only as directed by your health care provider.   Use antiperspirants as directed by your health care provider.   Limit or avoid foods or beverages that seem to increase your chances of sweating, such as:  ? Spicy food.  ? Caffeine.  ? Alcohol.  ? Foods that contain MSG.   If your feet sweat:  ? Wear sandals, when possible.  ?   health care provider. Make sure you discuss any questions you have with your health care provider. Document Released: 04/03/2005 Document Revised: 07/11/2015 Document Reviewed: 09/12/2013 Elsevier Interactive Patient Education  2018 [employer]     IF you received an x-ray today, you will receive an  invoice from [facility]. Please contact [facility] at [phone] with questions or concerns regarding your invoice.   IF you received labwork today, you will receive an invoice from [facility]. Please contact [facility] at [phone] with questions or concerns regarding your invoice.   Our billing staff will not be able to assist you with questions regarding bills from these companies.  You will be contacted with the lab results as soon as they are available. The fastest way to get your results is to activate your My Chart account. Instructions are located on the last page of this paperwork. If you have not heard from us regarding the results in 2 weeks, please contact this office.

## 2017-01-02 NOTE — Telephone Encounter (Signed)
Phoned into [facility] [address] spoke with Molly Maduroobert, Pharmacist, oxybutynin (Ditropan) 5 mg tablet, take 1 tablet daily by mouth. Qty# 270, with 1 refill.

## 2017-01-20 ENCOUNTER — Ambulatory Visit: Payer: BLUE CROSS/BLUE SHIELD | Admitting: Physician Assistant

## 2017-01-28 ENCOUNTER — Ambulatory Visit: Payer: BLUE CROSS/BLUE SHIELD | Admitting: Physician Assistant

## 2017-02-01 ENCOUNTER — Ambulatory Visit: Payer: BLUE CROSS/BLUE SHIELD | Admitting: Physician Assistant

## 2017-02-03 ENCOUNTER — Ambulatory Visit (INDEPENDENT_AMBULATORY_CARE_PROVIDER_SITE_OTHER): Payer: BLUE CROSS/BLUE SHIELD | Admitting: Physician Assistant

## 2017-02-03 ENCOUNTER — Other Ambulatory Visit: Payer: Self-pay

## 2017-02-03 ENCOUNTER — Encounter: Payer: Self-pay | Admitting: Physician Assistant

## 2017-02-03 VITALS — BP 140/90 | HR 82 | Temp 98.5°F | Resp 16 | Ht 74.0 in | Wt 257.6 lb

## 2017-02-03 DIAGNOSIS — R61 Generalized hyperhidrosis: Secondary | ICD-10-CM

## 2017-02-03 DIAGNOSIS — R5383 Other fatigue: Secondary | ICD-10-CM

## 2017-02-03 MED ORDER — OXYBUTYNIN CHLORIDE ER 10 MG PO TB24: 10.0000 mg | ORAL_TABLET | Freq: Every evening | ORAL | 2 refills | Status: DC

## 2017-02-03 NOTE — Progress Notes (Signed)
PRIMARY CARE AT [place], [place] [address] [phone]  Date:  02/03/2017   Name:  Adam Alvarez   DOB:  08/13/1985   MRN:  [id number]  PCP:  Patient, No Pcp Per    History of Present Illness:  Adam Alvarez is a 31 y.o. male patient who presents to PCP with  Chief Complaint  Patient presents with  . Follow-up    follow up HTN     --patient complains of fatigue.   --he is not having headaches --he is cutting back on sodas.  Practicing a keto-diet for  About 2.5 weeks.  This has 4 days of keto-diet, then 3 days of free day.  Which he may eat whatever he chooses.  Drinks juices, powerades (daily).   --works 12-16 hour days.  Works night shift. --he sleeps from 4-8 hours.  8 hours are rare.   --he will take sleeping pills for sleep, as he can not rest his mind.   --working out 9am-10am, then tries to sleep.  He has to work at 5 or 6 pm.   --his room is not dark, and has 4 windows.  Sleeps in the light.  Lived there for 2 years.   --he feels very tired after eating.   --today he is here without 2 days of sleep.   --mgm: high blood pressure.   There are no active problems to display for this patient.   Past Medical History:  Diagnosis Date  . Hyperhydrosis disorder   . Migraine     Past Surgical History:  Procedure Laterality Date  . JOINT REPLACEMENT    . KNEE SURGERY      Social History   Tobacco Use  . Smoking status: Former Research scientist (life sciences)  . Smokeless tobacco: Never Used  Substance Use Topics  . Alcohol use: No  . Drug use: No    History reviewed. No pertinent family history.  No Known Allergies  Medication list has been reviewed and updated.  Current Outpatient Medications on File Prior to Visit  Medication Sig Dispense Refill  . oxybutynin (DITROPAN) 5 MG tablet Take 1 tablet (5 mg total) 3 (three) times daily by mouth. 270 tablet 1  . aluminum chloride (DRYSOL) 20 % external solution Apply topically at bedtime. (Patient not taking:  Reported on 01/02/2017) 60 mL 6  . SUMAtriptan (IMITREX) 50 MG tablet Take 1 tablet (50 mg total) by mouth every 2 (two) hours as needed for migraine (maximum 264m in one day). 20 tablet 0   No current facility-administered medications on file prior to visit.     ROS ROS otherwise unremarkable unless listed above.  Physical Examination: BP 140/90 (BP Location: Left Arm, Patient Position: Sitting, Cuff Size: Large)   Pulse 82   Temp 98.5 F (36.9 C) (Oral)   Resp 16   Ht 6' 2"  (1.88 m)   Wt 257 lb 9.6 oz (116.8 kg)   SpO2 98%   BMI 33.07 kg/m  Ideal Body Weight: Weight in (lb) to have BMI = 25: 194.3  Physical Exam  Constitutional: He is oriented to person, place, and time. He appears well-developed and well-nourished. No distress.  HENT:  Head: Normocephalic and atraumatic.  Eyes: Conjunctivae and EOM are normal. Pupils are equal, round, and reactive to light.  Cardiovascular: Normal rate.  Pulmonary/Chest: Effort normal. No respiratory distress.  Neurological: He is alert and oriented to person, place, and time. No cranial nerve deficit.  Skin: Skin is warm and dry.  He is not diaphoretic.  Psychiatric: He has a normal mood and affect. His behavior is normal.     Assessment and Plan: Adam Alvarez is a 31 y.o. male who is here today for cc of  Chief Complaint  Patient presents with  . Follow-up    follow up HTN  --will obtain labs below.  Advised to stop taking additional shifts and just perform the 12 hour shift.  Discussed the dangers of sleep deprivation as well as the importance of routine especially with night shift.   He will check his BP at home by his wife who is a nurse when he has slept, and not at this time with 36 hours of wakefulness. --discussed appropriate sleep hygiene Other fatigue - Plan: CBC, CMP14+EGFR, TSH  Hyperhidrosis - Plan: oxybutynin (DITROPAN XL) 10 MG 24 hr tablet  Ivar Drape, PA-C [facility] 12/29/20187:10 PM

## 2017-02-03 NOTE — Patient Instructions (Addendum)
  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  Please make sure that you leave promptly.  You should follow the keto-diet 5 days per week.  Know that your body is going through heavy metabolism with this diet, and you must make sure you get an adequate amount of rest.  Make sure you are hydrating well with 64 oz of water.   We will try to take the oxybutynin mid-shift.    IF you received an x-ray today, you will receive an invoice from [facility]. Please contact [facility] at [phone] with questions or concerns regarding your invoice.   IF you received labwork today, you will receive an invoice from [facility]. Please contact [facility] at [phone] with questions or concerns regarding your invoice.   Our billing staff will not be able to assist you with questions regarding bills from these companies.  You will be contacted with the lab results as soon as they are available. The fastest way to get your results is to activate your My Chart account. Instructions are located on the last page of this paperwork. If you have not heard from us regarding the results in 2 weeks, please contact this office.

## 2017-02-04 LAB — CMP14+EGFR
ALBUMIN: 4.8 g/dL (ref 3.5–5.5)
ALT: 32 IU/L (ref 0–44)
AST: 24 IU/L (ref 0–40)
Albumin/Globulin Ratio: 1.8 (ref 1.2–2.2)
Alkaline Phosphatase: 57 IU/L (ref 39–117)
BILIRUBIN TOTAL: 0.7 mg/dL (ref 0.0–1.2)
BUN / CREAT RATIO: 10 (ref 9–20)
BUN: 11 mg/dL (ref 6–20)
CHLORIDE: 102 mmol/L (ref 96–106)
CO2: 26 mmol/L (ref 20–29)
CREATININE: 1.13 mg/dL (ref 0.76–1.27)
Calcium: 9.9 mg/dL (ref 8.7–10.2)
GFR calc non Af Amer: 87 mL/min/{1.73_m2} (ref >59)
GFR, EST AFRICAN AMERICAN: 100 mL/min/{1.73_m2} (ref >59)
GLUCOSE: 89 mg/dL (ref 65–99)
Globulin, Total: 2.6 g/dL (ref 1.5–4.5)
Potassium: 4.5 mmol/L (ref 3.5–5.2)
Sodium: 140 mmol/L (ref 134–144)
TOTAL PROTEIN: 7.4 g/dL (ref 6.0–8.5)

## 2017-02-04 LAB — CBC
HEMOGLOBIN: 14 g/dL (ref 13.0–17.7)
Hematocrit: 41.9 % (ref 37.5–51.0)
MCH: 29.7 pg (ref 26.6–33.0)
MCHC: 33.4 g/dL (ref 31.5–35.7)
MCV: 89 fL (ref 79–97)
PLATELETS: 232 10*3/uL (ref 150–379)
RBC: 4.72 x10E6/uL (ref 4.14–5.80)
RDW: 14.7 % (ref 12.3–15.4)
WBC: 4 10*3/uL (ref 3.4–10.8)

## 2017-02-04 LAB — TSH: TSH: 1.64 u[IU]/mL (ref 0.450–4.500)

## 2017-02-24 ENCOUNTER — Ambulatory Visit: Payer: BLUE CROSS/BLUE SHIELD | Admitting: Physician Assistant

## 2017-03-04 ENCOUNTER — Ambulatory Visit: Payer: BLUE CROSS/BLUE SHIELD | Admitting: Physician Assistant

## 2017-04-06 ENCOUNTER — Emergency Department (HOSPITAL_BASED_OUTPATIENT_CLINIC_OR_DEPARTMENT_OTHER): Payer: Managed Care, Other (non HMO)

## 2017-04-06 ENCOUNTER — Encounter (HOSPITAL_BASED_OUTPATIENT_CLINIC_OR_DEPARTMENT_OTHER): Payer: Self-pay | Admitting: Emergency Medicine

## 2017-04-06 ENCOUNTER — Emergency Department (HOSPITAL_BASED_OUTPATIENT_CLINIC_OR_DEPARTMENT_OTHER)
Admission: EM | Admit: 2017-04-06 | Discharge: 2017-04-06 | Disposition: A | Discharge status: Home / Self Care | Payer: Managed Care, Other (non HMO) | Attending: Emergency Medicine | Admitting: Emergency Medicine

## 2017-04-06 ENCOUNTER — Other Ambulatory Visit: Payer: Self-pay

## 2017-04-06 DIAGNOSIS — K5909 Other constipation: Secondary | ICD-10-CM | POA: Diagnosis not present

## 2017-04-06 DIAGNOSIS — R1084 Generalized abdominal pain: Secondary | ICD-10-CM

## 2017-04-06 DIAGNOSIS — Z87891 Personal history of nicotine dependence: Secondary | ICD-10-CM | POA: Insufficient documentation

## 2017-04-06 DIAGNOSIS — Z79899 Other long term (current) drug therapy: Secondary | ICD-10-CM | POA: Insufficient documentation

## 2017-04-06 LAB — CBC WITH DIFFERENTIAL/PLATELET
Basophils Absolute: 0 10*3/uL (ref 0.0–0.1)
Basophils Relative: 0 %
Eosinophils Absolute: 0 10*3/uL (ref 0.0–0.7)
Eosinophils Relative: 1 %
HEMATOCRIT: 38 % — AB (ref 39.0–52.0)
HEMOGLOBIN: 13.3 g/dL (ref 13.0–17.0)
LYMPHS PCT: 35 %
Lymphs Abs: 2 10*3/uL (ref 0.7–4.0)
MCH: 30.6 pg (ref 26.0–34.0)
MCHC: 35 g/dL (ref 30.0–36.0)
MCV: 87.4 fL (ref 78.0–100.0)
MONOS PCT: 8 %
Monocytes Absolute: 0.5 10*3/uL (ref 0.1–1.0)
NEUTROS ABS: 3.2 10*3/uL (ref 1.7–7.7)
NEUTROS PCT: 56 %
Platelets: 191 10*3/uL (ref 150–400)
RBC: 4.35 MIL/uL (ref 4.22–5.81)
RDW: 12.6 % (ref 11.5–15.5)
WBC: 5.7 10*3/uL (ref 4.0–10.5)

## 2017-04-06 LAB — URINALYSIS, ROUTINE W REFLEX MICROSCOPIC
Bilirubin Urine: NEGATIVE
Glucose, UA: NEGATIVE mg/dL
Hgb urine dipstick: NEGATIVE
Ketones, ur: NEGATIVE mg/dL
Leukocytes, UA: NEGATIVE
NITRITE: NEGATIVE
PH: 6 (ref 5.0–8.0)
Protein, ur: NEGATIVE mg/dL
Specific Gravity, Urine: >1.03 — ABNORMAL HIGH (ref 1.005–1.030)

## 2017-04-06 LAB — COMPREHENSIVE METABOLIC PANEL
ALK PHOS: 52 U/L (ref 38–126)
ALT: 30 U/L (ref 17–63)
AST: 23 U/L (ref 15–41)
Albumin: 4.1 g/dL (ref 3.5–5.0)
Anion gap: 7 (ref 5–15)
BILIRUBIN TOTAL: 0.8 mg/dL (ref 0.3–1.2)
BUN: 12 mg/dL (ref 6–20)
CALCIUM: 9 mg/dL (ref 8.9–10.3)
CO2: 27 mmol/L (ref 22–32)
CREATININE: 1.08 mg/dL (ref 0.61–1.24)
Chloride: 105 mmol/L (ref 101–111)
Glucose, Bld: 86 mg/dL (ref 65–99)
Potassium: 3.5 mmol/L (ref 3.5–5.1)
SODIUM: 139 mmol/L (ref 135–145)
TOTAL PROTEIN: 7.1 g/dL (ref 6.5–8.1)

## 2017-04-06 LAB — LIPASE, BLOOD: Lipase: 22 U/L (ref 11–51)

## 2017-04-06 MED ORDER — PEG 3350-KCL-NABCB-NACL-NASULF 236 G PO SOLR: 4000.0000 mL | Freq: Once | ORAL | 0 refills | Status: AC

## 2017-04-06 MED ORDER — ACETAMINOPHEN 325 MG PO TABS
650.0000 mg | ORAL_TABLET | Freq: Once | ORAL | Status: AC
Start: 2017-04-06 — End: 2017-04-06
  Administered 2017-04-06: 650 mg via ORAL
  Filled 2017-04-06: qty 2

## 2017-04-06 MED ORDER — DICYCLOMINE HCL 10 MG PO CAPS
10.0000 mg | ORAL_CAPSULE | Freq: Once | ORAL | Status: AC
Start: 2017-04-06 — End: 2017-04-06
  Administered 2017-04-06: 10 mg via ORAL
  Filled 2017-04-06: qty 1

## 2017-04-06 NOTE — ED Notes (Signed)
Pt states he tried taking a laxative but pain persisted.

## 2017-04-06 NOTE — ED Triage Notes (Signed)
Pt c/o lower abd pain and lower back pain x 3 days. Pt denies any n/v/d or urinary symptoms.

## 2017-04-06 NOTE — Discharge Instructions (Signed)
Were seen today for abdominal pain.  Your workup is largely reassuring.  You do have a hard stool burden on your x-ray and clinically report a history of constipation.  You will be given a bowel prep for cleanout.  After this if your pain is not improving, you may need to be reevaluated.  At this time there is no indication for CT.  If you have worsening pain, vomiting, fevers or any new or worsening symptoms you should be reevaluated.

## 2017-04-06 NOTE — ED Provider Notes (Signed)
[facility] EMERGENCY DEPARTMENT Provider Note   CSN: [id number] Arrival date & time: 04/06/17  0505     History   Chief Complaint Chief Complaint  Patient presents with  . Abdominal Pain    HPI Adam Alvarez Self is a 32 y.o. male.  HPI  This is a 32 year old male who presents with abdominal pain.  Patient reports 3-day history of intermittent sharp abdominal pain.  Does not seem to be affected by food.  Reports he took a laxative 2 days ago as he has not had a bowel movement in several days.  He reports having a hard bowel movement that was small.  No improvement of symptoms.  Denies vomiting.  Abdominal pain is rated 8 out of 10.  He states that the pain is migratory and sharp.  It sometimes radiates to his back.  Denies urinary symptoms, fevers.  Past Medical History:  Diagnosis Date  . Hyperhydrosis disorder   . Migraine     There are no active problems to display for this patient.   Past Surgical History:  Procedure Laterality Date  . JOINT REPLACEMENT    . KNEE SURGERY         Home Medications    Prior to Admission medications   Medication Sig Start Date End Date Taking? Authorizing Provider  aluminum chloride (DRYSOL) 20 % external solution Apply topically at bedtime. Patient not taking: Reported on 01/02/2017 09/06/14   Raeford Razor, MD  oxybutynin (DITROPAN XL) 10 MG 24 hr tablet Take 1 tablet (10 mg total) by mouth every evening. Take mid-shift 02/03/17   Trena Platt D, PA  oxybutynin (DITROPAN) 5 MG tablet Take 1 tablet (5 mg total) 3 (three) times daily by mouth. 01/02/17   English, Judeth Cornfield D, PA  polyethylene glycol (GOLYTELY) 236 g solution Take 4,000 mLs by mouth once for 1 dose. 240 mL (8 oz) every 10 minutes until 4 L are consumed or the rectal effluent is clear; rapid drinking of each portion is preferred to drinking small amounts continuously 04/06/17 04/06/17  Elverna Caffee, Mayer Masker, MD  SUMAtriptan (IMITREX) 50 MG tablet Take 1  tablet (50 mg total) by mouth every 2 (two) hours as needed for migraine (maximum 200mg  in one day). 10/30/10 10/30/11  Eber Hong, MD    Family History No family history on file.  Social History Social History   Tobacco Use  . Smoking status: Former Games developer  . Smokeless tobacco: Never Used  Substance Use Topics  . Alcohol use: No  . Drug use: No     Allergies   Patient has no known allergies.   Review of Systems Review of Systems  Constitutional: Negative for fever.  Respiratory: Negative for shortness of breath.   Cardiovascular: Negative for chest pain.  Gastrointestinal: Positive for abdominal pain and constipation. Negative for blood in stool, diarrhea, nausea and vomiting.  Genitourinary: Negative for dysuria and hematuria.  Musculoskeletal: Positive for back pain.  Neurological: Negative for weakness and numbness.  All other systems reviewed and are negative.    Physical Exam Updated Vital Signs BP (!) 143/94 (BP Location: Right Arm)   Pulse 85   Temp 98.2 F (36.8 C) (Oral)   Resp 18   Ht 6\' 3"  (1.905 m)   Wt 112.9 kg (249 lb)   SpO2 97%   BMI 31.12 kg/m   Physical Exam  Constitutional: He is oriented to person, place, and time. He appears well-developed and well-nourished. He does not appear ill.  HENT:  Head: Normocephalic and atraumatic.  Neck: Neck supple.  Cardiovascular: Normal rate, regular rhythm and normal heart sounds.  No murmur heard. Pulmonary/Chest: Effort normal and breath sounds normal. No respiratory distress. He has no wheezes.  Abdominal: Soft. Normal appearance and bowel sounds are normal. There is no tenderness. There is no rebound.  No significant tenderness to palpation, no rebound or guarding  Musculoskeletal: He exhibits no edema.  Lymphadenopathy:    He has no cervical adenopathy.  Neurological: He is alert and oriented to person, place, and time.  Skin: Skin is warm and dry.  Psychiatric: He has a normal mood and  affect.  Nursing note and vitals reviewed.    ED Treatments / Results  Labs (all labs ordered are listed, but only abnormal results are displayed) Labs Reviewed  URINALYSIS, ROUTINE W REFLEX MICROSCOPIC - Abnormal; Notable for the following components:      Result Value   Specific Gravity, Urine >1.030 (*)    All other components within normal limits  CBC WITH DIFFERENTIAL/PLATELET - Abnormal; Notable for the following components:   HCT 38.0 (*)    All other components within normal limits  COMPREHENSIVE METABOLIC PANEL  LIPASE, BLOOD    EKG  EKG Interpretation None       Radiology Dg Abdomen Acute W/chest  Result Date: 04/06/2017 CLINICAL DATA:  Abdominal pain EXAM: DG ABDOMEN ACUTE W/ 1V CHEST COMPARISON:  None. FINDINGS: There is no evidence of dilated bowel loops or free intraperitoneal air. Moderate colonic stool volume. No radiopaque calculi or other significant radiographic abnormality is seen. Heart size and mediastinal contours are within normal limits. Both lungs are clear. IMPRESSION: Moderate colonic stool volume.  No acute cardiopulmonary disease. Electronically Signed   By: Deatra RobinsonKevin  Herman M.D.   On: 04/06/2017 06:09    Procedures Procedures (including critical care time)  Medications Ordered in ED Medications  dicyclomine (BENTYL) capsule 10 mg (10 mg Oral Given 04/06/17 0538)  acetaminophen (TYLENOL) tablet 650 mg (650 mg Oral Given 04/06/17 [id number])     Initial Impression / Assessment and Plan / ED Course  I have reviewed the triage vital signs and the nursing notes.  Pertinent labs & imaging results that were available during my care of the patient were reviewed by me and considered in my medical decision making (see chart for details).     Patient presents with abdominal pain.  History suggestive of some constipation.  Abdominal exam is benign.  He does have radiation to the back.  Low risk for AAA and vital signs are largely reassuring.  He does have a  blood pressure 143/94.  However, he is otherwise young and without a family history.  Basic lab work obtained.  No evidence of pancreatitis.  LFTs normal.  Acute abdominal series obtained and he does have a moderate stool burden.  He was given Bentyl and Tylenol with minimal improvement of his pain.  However, would like to avoid narcotics as I believe some of his symptoms are likely related to constipation.  On recheck, he does report that he has had a history of issues with constipation.  Recommend GoLYTELY cleanout.  I did discuss with patient that if he does not have improvement of his pain after cleanout, he will need to be reevaluated.  At this time no indication for further imaging.  After history, exam, and medical workup I feel the patient has been appropriately medically screened and is safe for discharge home. Pertinent diagnoses were discussed with the patient.  Patient was given return precautions.   Final Clinical Impressions(s) / ED Diagnoses   Final diagnoses:  Other constipation  Generalized abdominal pain    ED Discharge Orders        Ordered    polyethylene glycol (GOLYTELY) 236 g solution   Once     04/06/17 1610       Shon Baton, MD 04/06/17 0630

## 2017-04-06 NOTE — ED Notes (Signed)
ED Provider at bedside. 

## 2017-04-10 ENCOUNTER — Ambulatory Visit: Payer: BLUE CROSS/BLUE SHIELD | Admitting: Urgent Care

## 2017-04-11 ENCOUNTER — Other Ambulatory Visit: Payer: Self-pay

## 2017-04-11 ENCOUNTER — Emergency Department (HOSPITAL_BASED_OUTPATIENT_CLINIC_OR_DEPARTMENT_OTHER)
Admission: EM | Admit: 2017-04-11 | Discharge: 2017-04-11 | Disposition: A | Discharge status: Home / Self Care | Payer: Managed Care, Other (non HMO) | Attending: Emergency Medicine | Admitting: Emergency Medicine

## 2017-04-11 ENCOUNTER — Emergency Department (HOSPITAL_BASED_OUTPATIENT_CLINIC_OR_DEPARTMENT_OTHER): Payer: Managed Care, Other (non HMO)

## 2017-04-11 ENCOUNTER — Encounter (HOSPITAL_BASED_OUTPATIENT_CLINIC_OR_DEPARTMENT_OTHER): Payer: Self-pay | Admitting: *Deleted

## 2017-04-11 DIAGNOSIS — R1032 Left lower quadrant pain: Secondary | ICD-10-CM | POA: Diagnosis not present

## 2017-04-11 DIAGNOSIS — Z79899 Other long term (current) drug therapy: Secondary | ICD-10-CM | POA: Diagnosis not present

## 2017-04-11 DIAGNOSIS — I10 Essential (primary) hypertension: Secondary | ICD-10-CM | POA: Insufficient documentation

## 2017-04-11 DIAGNOSIS — Z87891 Personal history of nicotine dependence: Secondary | ICD-10-CM | POA: Insufficient documentation

## 2017-04-11 HISTORY — DX: Essential (primary) hypertension: I10

## 2017-04-11 LAB — BASIC METABOLIC PANEL
ANION GAP: 9 (ref 5–15)
BUN: 18 mg/dL (ref 6–20)
CALCIUM: 9.5 mg/dL (ref 8.9–10.3)
CO2: 27 mmol/L (ref 22–32)
Chloride: 102 mmol/L (ref 101–111)
Creatinine, Ser: 1.32 mg/dL — ABNORMAL HIGH (ref 0.61–1.24)
GFR calc Af Amer: >60 mL/min (ref >60)
GFR calc non Af Amer: >60 mL/min (ref >60)
Glucose, Bld: 90 mg/dL (ref 65–99)
POTASSIUM: 4 mmol/L (ref 3.5–5.1)
Sodium: 138 mmol/L (ref 135–145)

## 2017-04-11 LAB — CBC WITH DIFFERENTIAL/PLATELET
BASOS ABS: 0 10*3/uL (ref 0.0–0.1)
Basophils Relative: 1 %
Eosinophils Absolute: 0.1 10*3/uL (ref 0.0–0.7)
Eosinophils Relative: 2 %
HEMATOCRIT: 40.4 % (ref 39.0–52.0)
HEMOGLOBIN: 14.3 g/dL (ref 13.0–17.0)
LYMPHS PCT: 37 %
Lymphs Abs: 1.5 10*3/uL (ref 0.7–4.0)
MCH: 30.6 pg (ref 26.0–34.0)
MCHC: 35.4 g/dL (ref 30.0–36.0)
MCV: 86.5 fL (ref 78.0–100.0)
MONO ABS: 0.4 10*3/uL (ref 0.1–1.0)
Monocytes Relative: 10 %
NEUTROS ABS: 2.1 10*3/uL (ref 1.7–7.7)
NEUTROS PCT: 50 %
Platelets: 217 10*3/uL (ref 150–400)
RBC: 4.67 MIL/uL (ref 4.22–5.81)
RDW: 12.5 % (ref 11.5–15.5)
WBC: 4.1 10*3/uL (ref 4.0–10.5)

## 2017-04-11 LAB — URINALYSIS, ROUTINE W REFLEX MICROSCOPIC
Bilirubin Urine: NEGATIVE
Glucose, UA: NEGATIVE mg/dL
Hgb urine dipstick: NEGATIVE
KETONES UR: NEGATIVE mg/dL
LEUKOCYTES UA: NEGATIVE
NITRITE: NEGATIVE
PROTEIN: NEGATIVE mg/dL
Specific Gravity, Urine: 1.02 (ref 1.005–1.030)
pH: 6 (ref 5.0–8.0)

## 2017-04-11 MED ORDER — IOPAMIDOL (ISOVUE-300) INJECTION 61%
100.0000 mL | Freq: Once | INTRAVENOUS | Status: AC | PRN
  Administered 2017-04-11: 100 mL via INTRAVENOUS

## 2017-04-11 NOTE — ED Notes (Signed)
Drinking po contrast

## 2017-04-11 NOTE — ED Triage Notes (Signed)
Pt states he was seen on Tuesday for abd pain. States that he was given a laxative and did have results but states he is still having abdominal pain. Lower abd pain. States pain is constant. Describes as sharp. Denies fever. Denies n/v. Denies any urinary symptoms.

## 2017-04-11 NOTE — ED Notes (Signed)
Returned from CT.

## 2017-04-11 NOTE — ED Notes (Signed)
Pt to CT

## 2017-04-11 NOTE — ED Provider Notes (Addendum)
[facility] Provider Note: [name], MD, FACEP  CSN: [id number] MRN: [id number] ARRIVAL: 04/11/17 at 0101 ROOM: 10  CHIEF COMPLAINT  Abdominal Pain   HISTORY OF PRESENT ILLNESS  04/11/17 4:22 AM Adam Alvarez is a 32 y.o. male who was seen here 5 days ago for abdominal pain for several days.  He had an acute abdominal series which showed a moderate colonic stool volume and he was treated for constipation with GoLYTELY.  Despite taking the GoLYTELY and having multiple bowel movements subsequently he has still been having left lower quadrant pain.  The pain acutely worsened yesterday.  He describes the pain as sharp, not crampy, and rates it as a 9 out of 10.  It is somewhat worse with movement or certain positions.  The pain radiates to his left lower back.  He denies nausea, vomiting or fever.  He does take oxybutynin for hyperhidrosis.   Past Medical History:  Diagnosis Date  . Hyperhydrosis disorder   . Hypertension   . Migraine     Past Surgical History:  Procedure Laterality Date  . JOINT REPLACEMENT    . KNEE SURGERY      No family history on file.  Social History   Tobacco Use  . Smoking status: Former Games developer  . Smokeless tobacco: Never Used  Substance Use Topics  . Alcohol use: No  . Drug use: No    Prior to Admission medications   Medication Sig Start Date End Date Taking? Authorizing Provider  aluminum chloride (DRYSOL) 20 % external solution Apply topically at bedtime. Patient not taking: Reported on 01/02/2017 09/06/14   Raeford Razor, MD  oxybutynin (DITROPAN XL) 10 MG 24 hr tablet Take 1 tablet (10 mg total) by mouth every evening. Take mid-shift 02/03/17   Trena Platt D, PA  oxybutynin (DITROPAN) 5 MG tablet Take 1 tablet (5 mg total) 3 (three) times daily by mouth. 01/02/17   Trena Platt D, PA  SUMAtriptan (IMITREX) 50 MG tablet Take 1 tablet (50 mg total) by mouth every 2 (two) hours as needed for migraine (maximum  200mg  in one day). 10/30/10 10/30/11  Eber Hong, MD    Allergies Patient has no known allergies.   REVIEW OF SYSTEMS  Negative except as noted here or in the History of Present Illness.   PHYSICAL EXAMINATION  Initial Vital Signs Blood pressure (!) 148/108, pulse 84, temperature 99 F (37.2 C), temperature source Oral, resp. rate 18, height 6\' 3"  (1.905 m), weight 117 kg (258 lb), SpO2 100 %.  Examination General: Well-developed, well-nourished male in no acute distress; appearance consistent with age of record HENT: normocephalic; atraumatic Eyes: pupils equal, round and reactive to light; extraocular muscles intact Neck: supple Heart: regular rate and rhythm Lungs: clear to auscultation bilaterally Abdomen: soft; nondistended; left lower quadrant tenderness; no masses or hepatosplenomegaly; bowel sounds present GU: No CVA tenderness Extremities: No deformity; full range of motion; pulses normal Neurologic: Awake, alert and oriented; motor function intact in all extremities and symmetric; no facial droop Skin: Warm and dry Psychiatric: Normal mood and affect   RESULTS  Summary of this visit's results, reviewed by myself:   EKG Interpretation  Date/Time:    Ventricular Rate:    PR Interval:    QRS Duration:   QT Interval:    QTC Calculation:   R Axis:     Text Interpretation:        Laboratory Studies: Results for orders placed or performed during the hospital  encounter of 04/11/17 (from the past 24 hour(s))  Urinalysis, Routine w reflex microscopic     Status: None   Collection Time: 04/11/17  1:10 AM  Result Value Ref Range   Color, Urine YELLOW YELLOW   APPearance CLEAR CLEAR   Specific Gravity, Urine 1.020 1.005 - 1.030   pH 6.0 5.0 - 8.0   Glucose, UA NEGATIVE NEGATIVE mg/dL   Hgb urine dipstick NEGATIVE NEGATIVE   Bilirubin Urine NEGATIVE NEGATIVE   Ketones, ur NEGATIVE NEGATIVE mg/dL   Protein, ur NEGATIVE NEGATIVE mg/dL   Nitrite NEGATIVE  NEGATIVE   Leukocytes, UA NEGATIVE NEGATIVE  CBC with Differential     Status: None   Collection Time: 04/11/17  1:25 AM  Result Value Ref Range   WBC 4.1 4.0 - 10.5 K/uL   RBC 4.67 4.22 - 5.81 MIL/uL   Hemoglobin 14.3 13.0 - 17.0 g/dL   HCT 16.140.4 09.639.0 - 04.552.0 %   MCV 86.5 78.0 - 100.0 fL   MCH 30.6 26.0 - 34.0 pg   MCHC 35.4 30.0 - 36.0 g/dL   RDW 40.912.5 81.111.5 - 91.415.5 %   Platelets 217 150 - 400 K/uL   Neutrophils Relative % 50 %   Neutro Abs 2.1 1.7 - 7.7 K/uL   Lymphocytes Relative 37 %   Lymphs Abs 1.5 0.7 - 4.0 K/uL   Monocytes Relative 10 %   Monocytes Absolute 0.4 0.1 - 1.0 K/uL   Eosinophils Relative 2 %   Eosinophils Absolute 0.1 0.0 - 0.7 K/uL   Basophils Relative 1 %   Basophils Absolute 0.0 0.0 - 0.1 K/uL  Basic metabolic panel     Status: Abnormal   Collection Time: 04/11/17  1:25 AM  Result Value Ref Range   Sodium 138 135 - 145 mmol/L   Potassium 4.0 3.5 - 5.1 mmol/L   Chloride 102 101 - 111 mmol/L   CO2 27 22 - 32 mmol/L   Glucose, Bld 90 65 - 99 mg/dL   BUN 18 6 - 20 mg/dL   Creatinine, Ser 7.821.32 (H) 0.61 - 1.24 mg/dL   Calcium 9.5 8.9 - 95.610.3 mg/dL   GFR calc non Af Amer >60 >60 mL/min   GFR calc Af Amer >60 >60 mL/min   Anion gap 9 5 - 15   Imaging Studies: Ct Abdomen Pelvis W Contrast  Result Date: 04/11/2017 CLINICAL DATA:  32 year old male with lower abdominal pain. EXAM: CT ABDOMEN AND PELVIS WITH CONTRAST TECHNIQUE: Multidetector CT imaging of the abdomen and pelvis was performed using the standard protocol following bolus administration of intravenous contrast. CONTRAST:  100mL ISOVUE-300 IOPAMIDOL (ISOVUE-300) INJECTION 61% COMPARISON:  Abdominal radiograph dated 04/06/2017 and CT dated 02/09/2007 FINDINGS: Lower chest: The visualized lung bases are clear. No intra-abdominal free air or free fluid. Hepatobiliary: The liver is unremarkable. The gallbladder is predominantly contracted. Pancreas: Unremarkable. No pancreatic ductal dilatation or surrounding  inflammatory changes. Spleen: Normal in size without focal abnormality. Adrenals/Urinary Tract: Adrenal glands are unremarkable. Kidneys are normal, without renal calculi, focal lesion, or hydronephrosis. Bladder is unremarkable. Stomach/Bowel: Stomach is within normal limits. Appendix appears normal. No evidence of bowel wall thickening, distention, or inflammatory changes. Vascular/Lymphatic: No significant vascular findings are present. No enlarged abdominal or pelvic lymph nodes. Reproductive: The prostate and seminal vesicles are grossly unremarkable. Other: No abdominal wall hernia or abnormality. No abdominopelvic ascites. Musculoskeletal: No acute or significant osseous findings. IMPRESSION: No acute intra-abdominal or pelvic pathology. Electronically Signed   By: Ceasar MonsArash  Radparvar M.D.  On: 04/11/2017 03:10    ED COURSE  Nursing notes and initial vitals signs, including pulse oximetry, reviewed.  Vitals:   04/11/17 0113 04/11/17 0114 04/11/17 0442  BP: (!) 148/108  (!) 162/92  Pulse: 84  69  Resp: 18  16  Temp: 99 F (37.2 C)    TempSrc: Oral    SpO2: 100%  98%  Weight:  117 kg (258 lb)   Height:  6\' 3"  (1.905 m)    4:51 AM Patient advised of reassuring CT scan and lab studies.  The exact cause of his pain is not clear at this time.  If symptoms persist we will refer him to gastroenterology for further evaluation.  He was advised that he may need to take a laxative on a regular basis given that oxybutynin can cause constipation, but this would best be managed by a GI specialist.  PROCEDURES    ED DIAGNOSES     ICD-10-CM   1. Left lower quadrant pain R10.32        Paula Libra, MD 04/11/17 0452    Paula Libra, MD 04/11/17 [phone]

## 2017-05-26 ENCOUNTER — Encounter: Payer: Self-pay | Admitting: Physician Assistant

## 2017-06-19 ENCOUNTER — Ambulatory Visit (INDEPENDENT_AMBULATORY_CARE_PROVIDER_SITE_OTHER): Payer: Managed Care, Other (non HMO) | Admitting: Family Medicine

## 2017-06-19 ENCOUNTER — Other Ambulatory Visit: Payer: Self-pay

## 2017-06-19 ENCOUNTER — Encounter: Payer: Self-pay | Admitting: Family Medicine

## 2017-06-19 VITALS — BP 126/88 | HR 74 | Temp 98.7°F | Resp 18 | Ht 75.0 in | Wt 272.6 lb

## 2017-06-19 DIAGNOSIS — M79674 Pain in right toe(s): Secondary | ICD-10-CM | POA: Diagnosis not present

## 2017-06-19 DIAGNOSIS — Z1389 Encounter for screening for other disorder: Secondary | ICD-10-CM | POA: Diagnosis not present

## 2017-06-19 MED ORDER — INDOMETHACIN 50 MG PO CAPS: 50.0000 mg | ORAL_CAPSULE | Freq: Three times a day (TID) | ORAL | 0 refills | Status: DC | PRN

## 2017-06-19 NOTE — Patient Instructions (Addendum)
Toe pain appears to be due to gout.  Start indomethacin up to 3 times per day with food.  Stop that medicine if it causes any stomach upset.  I will check a gout test, but as we discussed that may not be as reliable during a flare.  If pain is not improving into next week, please return for further evaluation and possible imaging.  Sooner if worse.  Please return to discuss weight further.  I am happy to review the notes from [facility] and we can look into other evaluation at that time.  Thank you for coming in today.   Gout Gout is painful swelling that can occur in some of your joints. Gout is a type of arthritis. This condition is caused by having too much uric acid in your body. Uric acid is a chemical that forms when your body breaks down substances called purines. Purines are important for building body proteins. When your body has too much uric acid, sharp crystals can form and build up inside your joints. This causes pain and swelling. Gout attacks can happen quickly and be very painful (acute gout). Over time, the attacks can affect more joints and become more frequent (chronic gout). Gout can also cause uric acid to build up under your skin and inside your kidneys. What are the causes? This condition is caused by too much uric acid in your blood. This can occur because:  Your kidneys do not remove enough uric acid from your blood. This is the most common cause.  Your body makes too much uric acid. This can occur with some cancers and cancer treatments. It can also occur if your body is breaking down too many red blood cells (hemolytic anemia).  You eat too many foods that are high in purines. These foods include organ meats and some seafood. Alcohol, especially beer, is also high in purines.  A gout attack may be triggered by trauma or stress. What increases the risk? This condition is more likely to develop in people who:  Have a family history of gout.  Are male and  middle-aged.  Are male and have gone through menopause.  Are obese.  Frequently drink alcohol, especially beer.  Are dehydrated.  Lose weight too quickly.  Have an organ transplant.  Have lead poisoning.  Take certain medicines, including aspirin, cyclosporine, diuretics, levodopa, and niacin.  Have kidney disease or psoriasis.  What are the signs or symptoms? An attack of acute gout happens quickly. It usually occurs in just one joint. The most common place is the big toe. Attacks often start at night. Other joints that may be affected include joints of the feet, ankle, knee, fingers, wrist, or elbow. Symptoms may include:  Severe pain.  Warmth.  Swelling.  Stiffness.  Tenderness. The affected joint may be very painful to touch.  Shiny, red, or purple skin.  Chills and fever.  Chronic gout may cause symptoms more frequently. More joints may be involved. You may also have white or yellow lumps (tophi) on your hands or feet or in other areas near your joints. How is this diagnosed? This condition is diagnosed based on your symptoms, medical history, and physical exam. You may have tests, such as:  Blood tests to measure uric acid levels.  Removal of joint fluid with a needle (aspiration) to look for uric acid crystals.  X-rays to look for joint damage.  How is this treated? Treatment for this condition has two phases: treating an acute attack and  preventing future attacks. Acute gout treatment may include medicines to reduce pain and swelling, including:  NSAIDs.  Steroids. These are strong anti-inflammatory medicines that can be taken by mouth (orally) or injected into a joint.  Colchicine. This medicine relieves pain and swelling when it is taken soon after an attack. It can be given orally or through an IV tube.  Preventive treatment may include:  Daily use of smaller doses of NSAIDs or colchicine.  Use of a medicine that reduces uric acid levels in  your blood.  Changes to your diet. You may need to see a specialist about healthy eating (dietitian).  Follow these instructions at home: During a Gout Attack  If directed, apply ice to the affected area: ? Put ice in a plastic bag. ? Place a towel between your skin and the bag. ? Leave the ice on for 20 minutes, 2-3 times a day.  Rest the joint as much as possible. If the affected joint is in your leg, you may be given crutches to use.  Raise (elevate) the affected joint above the level of your heart as often as possible.  Drink enough fluids to keep your urine clear or pale yellow.  Take over-the-counter and prescription medicines only as told by your health care provider.  Do not drive or operate heavy machinery while taking prescription pain medicine.  Follow instructions from your health care provider about eating or drinking restrictions.  Return to your normal activities as told by your health care provider. Ask your health care provider what activities are safe for you. Avoiding Future Gout Attacks  Follow a low-purine diet as told by your dietitian or health care provider. Avoid foods and drinks that are high in purines, including liver, kidney, anchovies, asparagus, herring, mushrooms, mussels, and beer.  Limit alcohol intake to no more than 1 drink a day for nonpregnant women and 2 drinks a day for men. One drink equals 12 oz of beer, 5 oz of wine, or 1 oz of hard liquor.  Maintain a healthy weight or lose weight if you are overweight. If you want to lose weight, talk with your health care provider. It is important that you do not lose weight too quickly.  Start or maintain an exercise program as told by your health care provider.  Drink enough fluids to keep your urine clear or pale yellow.  Take over-the-counter and prescription medicines only as told by your health care provider.  Keep all follow-up visits as told by your health care provider. This is  important. Contact a health care provider if:  You have another gout attack.  You continue to have symptoms of a gout attack after10 days of treatment.  You have side effects from your medicines.  You have chills or a fever.  You have burning pain when you urinate.  You have pain in your lower back or belly. Get help right away if:  You have severe or uncontrolled pain.  You cannot urinate. This information is not intended to replace advice given to you by your health care provider. Make sure you discuss any questions you have with your health care provider. Document Released: 01/31/2000 Document Revised: 07/11/2015 Document Reviewed: 11/15/2014 Elsevier Interactive Patient Education  2018 [employer].     IF you received an x-ray today, you will receive an invoice from [facility]. Please contact [facility] at [phone] with questions or concerns regarding your invoice.   IF you received labwork today, you will receive an  invoice from [facility] [place]. Please contact [facility] at [phone] with questions or concerns regarding your invoice.   Our billing staff will not be able to assist you with questions regarding bills from these companies.  You will be contacted with the lab results as soon as they are available. The fastest way to get your results is to activate your My Chart account. Instructions are located on the last page of this paperwork. If you have not heard from [place] regarding the results in 2 weeks, please contact this office.

## 2017-06-19 NOTE — Progress Notes (Signed)
Subjective:  By signing my name below, I, Stann Ore, attest that this documentation has been prepared under the direction and in the presence of Meredith Staggers, MD. Electronically Signed: Stann Ore, Scribe. 06/19/2017 , 12:18 PM .  Patient was seen in Room 12 .   Patient ID: Adam Alvarez, male    DOB: January 23, 1986, 32 y.o.   MRN: [id number] Chief Complaint  Patient presents with  . Foot Injury    right foot big toe area having a burning sensation and can not bend x2-3days    HPI Dontavion Noxon Schemm is a 32 y.o. male  On chart review, he was seen in 2013 with lower leg swelling after running a half marathon. He was found to have a distal DVT of the right posterior tibial vein; treated with aspirin with resolution, noted on Dec 29th [place].   Patient states having right great toe pain that started 2-3 days ago. He denies any known injury to the area. Prior to the pain, he has felt slight intermittent burning sensation over the medial aspect great toe joint. He denies joint swelling in other areas. He denies any change in diet lately. His wife informed him that the area looks a [place]ia more swollen. He denies sports or running lately.   He is a Hospital doctor for [employer], driving from hub to hub.   There are no active problems to display for this patient.  Past Medical History:  Diagnosis Date  . Hyperhydrosis disorder   . Hypertension   . Migraine    Past Surgical History:  Procedure Laterality Date  . JOINT REPLACEMENT    . KNEE SURGERY     No Known Allergies Prior to Admission medications   Medication Sig Start Date End Date Taking? Authorizing Provider  oxybutynin (DITROPAN XL) 10 MG 24 hr tablet Take 1 tablet (10 mg total) by mouth every evening. Take mid-shift 02/03/17   Trena Platt D, PA  oxybutynin (DITROPAN) 5 MG tablet Take 1 tablet (5 mg total) 3 (three) times daily by mouth. 01/02/17   Trena Platt D, PA  SUMAtriptan (IMITREX) 50 MG tablet Take 1 tablet (50 mg  total) by mouth every 2 (two) hours as needed for migraine (maximum  in one day). 10/30/10 10/30/11  Eber Hong, MD   Social History   Socioeconomic History  . Marital status: Married    Spouse name: Not on file  . Number of children: Not on file  . Years of education: Not on file  . Highest education level: Not on file  Occupational History  . Not on file  Social Needs  . Financial resource strain: Not on file  . Food insecurity:    Worry: Not on file    Inability: Not on file  . Transportation needs:    Medical: Not on file    Non-medical: Not on file  Tobacco Use  . Smoking status: Former Games developer  . Smokeless tobacco: Never Used  Substance and Sexual Activity  . Alcohol use: No  . Drug use: No  . Sexual activity: Not on file  Lifestyle  . Physical activity:    Days per week: Not on file    Minutes per session: Not on file  . Stress: Not on file  Relationships  . Social connections:    Talks on phone: Not on file    Gets together: Not on file    Attends religious service: Not on file    Active member of club or organization:  Not on file    Attends meetings of clubs or organizations: Not on file    Relationship status: Not on file  . Intimate partner violence:    Fear of current or ex partner: Not on file    Emotionally abused: Not on file    Physically abused: Not on file    Forced sexual activity: Not on file  Other Topics Concern  . Not on file  Social History Narrative  . Not on file   Review of Systems  Constitutional: Negative for fatigue and unexpected weight change.  Eyes: Negative for visual disturbance.  Respiratory: Negative for cough, chest tightness and shortness of breath.   Cardiovascular: Negative for chest pain, palpitations and leg swelling.  Gastrointestinal: Negative for abdominal pain and blood in stool.  Musculoskeletal: Positive for arthralgias and joint swelling. Negative for gait problem.  Neurological: Negative for dizziness,  light-headedness and headaches.       Objective:   Physical Exam  Constitutional: He is oriented to person, place, and time. He appears well-developed and well-nourished. No distress.  HENT:  Head: Normocephalic and atraumatic.  Eyes: Pupils are equal, round, and reactive to light. EOM are normal.  Neck: Neck supple.  Cardiovascular: Normal rate.  Pulmonary/Chest: Effort normal. No respiratory distress.  Musculoskeletal: Normal range of motion.  Right foot: slight tenderness into the 1st MTP of great toe on the right with slight extension into the IP of great toe; slight warmth, slight soft tissue swelling of the affected area; somewhat guarded ROM of MTP and IP joints  Neurological: He is alert and oriented to person, place, and time.  Skin: Skin is warm and dry.  Psychiatric: He has a normal mood and affect. His behavior is normal.  Nursing note and vitals reviewed.   Vitals:   06/19/17 1146  BP: 126/88  Pulse: 74  Resp: 18  Temp: 98.7 F (37.1 C)  TempSrc: Oral  SpO2: 98%  Weight: 272 lb 9.6 oz (123.7 kg)  Height:  (1.905 m)        Assessment & Plan:   Wylder Macomber Wirtanen is a 32 y.o. male Screening for gout - Plan: Uric Acid, indomethacin (INDOCIN) 50 MG capsule  Great toe pain, right - Plan: Uric Acid, indomethacin (INDOCIN) 50 MG capsule  -Suspected podagra/gout of right great toe.  Some similar milder symptoms in the past in same area.  May be due to recent change in diet.  -Check uric acid, but discussed this may not be as reliable during acute flare  -Indomethacin 50 mill grams 3 times daily as needed with food.  Potential side effects/risks discussed  -RTC precautions if not improving into next week for possible imaging, sooner if worse  -If recurrent symptoms, may need daily medication.    At end of visit he did want to discuss weight gain, plans to schedule separate appointment to discuss this issue further so we are able to review previous records of  prior provider.  Meds ordered this encounter  Medications  . indomethacin (INDOCIN) 50 MG capsule    Sig: Take 1 capsule (50 mg total) by mouth 3 (three) times daily as needed. Take with food.    Dispense:  30 capsule    Refill:  0   Patient Instructions   Toe pain appears to be due to gout.  Start indomethacin up to 3 times per day with food.  Stop that medicine if it causes any stomach upset.  I will check a gout  test, but as we discussed that may not be as reliable during a flare.  If pain is not improving into next week, please return for further evaluation and possible imaging.  Sooner if worse.  Please return to discuss weight further.  I am happy to review the notes from [facility] and we can look into other evaluation at that time.  Thank you for coming in today.   Gout Gout is painful swelling that can occur in some of your joints. Gout is a type of arthritis. This condition is caused by having too much uric acid in your body. Uric acid is a chemical that forms when your body breaks down substances called purines. Purines are important for building body proteins. When your body has too much uric acid, sharp crystals can form and build up inside your joints. This causes pain and swelling. Gout attacks can happen quickly and be very painful (acute gout). Over time, the attacks can affect more joints and become more frequent (chronic gout). Gout can also cause uric acid to build up under your skin and inside your kidneys. What are the causes? This condition is caused by too much uric acid in your blood. This can occur because:  Your kidneys do not remove enough uric acid from your blood. This is the most common cause.  Your body makes too much uric acid. This can occur with some cancers and cancer treatments. It can also occur if your body is breaking down too many red blood cells (hemolytic anemia).  You eat too many foods that are high in purines. These foods include organ meats  and some seafood. Alcohol, especially beer, is also high in purines.  A gout attack may be triggered by trauma or stress. What increases the risk? This condition is more likely to develop in people who:  Have a family history of gout.  Are male and middle-aged.  Are male and have gone through menopause.  Are obese.  Frequently drink alcohol, especially beer.  Are dehydrated.  Lose weight too quickly.  Have an organ transplant.  Have lead poisoning.  Take certain medicines, including aspirin, cyclosporine, diuretics, levodopa, and niacin.  Have kidney disease or psoriasis.  What are the signs or symptoms? An attack of acute gout happens quickly. It usually occurs in just one joint. The most common place is the big toe. Attacks often start at night. Other joints that may be affected include joints of the feet, ankle, knee, fingers, wrist, or elbow. Symptoms may include:  Severe pain.  Warmth.  Swelling.  Stiffness.  Tenderness. The affected joint may be very painful to touch.  Shiny, red, or purple skin.  Chills and fever.  Chronic gout may cause symptoms more frequently. More joints may be involved. You may also have white or yellow lumps (tophi) on your hands or feet or in other areas near your joints. How is this diagnosed? This condition is diagnosed based on your symptoms, medical history, and physical exam. You may have tests, such as:  Blood tests to measure uric acid levels.  Removal of joint fluid with a needle (aspiration) to look for uric acid crystals.  X-rays to look for joint damage.  How is this treated? Treatment for this condition has two phases: treating an acute attack and preventing future attacks. Acute gout treatment may include medicines to reduce pain and swelling, including:  NSAIDs.  Steroids. These are strong anti-inflammatory medicines that can be taken by mouth (orally) or injected into a  joint.  Colchicine. This medicine  relieves pain and swelling when it is taken soon after an attack. It can be given orally or through an IV tube.  Preventive treatment may include:  Daily use of smaller doses of NSAIDs or colchicine.  Use of a medicine that reduces uric acid levels in your blood.  Changes to your diet. You may need to see a specialist about healthy eating (dietitian).  Follow these instructions at home: During a Gout Attack  If directed, apply ice to the affected area: ? Put ice in a plastic bag. ? Place a towel between your skin and the bag. ? Leave the ice on for 20 minutes, 2-3 times a day.  Rest the joint as much as possible. If the affected joint is in your leg, you may be given crutches to use.  Raise (elevate) the affected joint above the level of your heart as often as possible.  Drink enough fluids to keep your urine clear or pale yellow.  Take over-the-counter and prescription medicines only as told by your health care provider.  Do not drive or operate heavy machinery while taking prescription pain medicine.  Follow instructions from your health care provider about eating or drinking restrictions.  Return to your normal activities as told by your health care provider. Ask your health care provider what activities are safe for you. Avoiding Future Gout Attacks  Follow a low-purine diet as told by your dietitian or health care provider. Avoid foods and drinks that are high in purines, including liver, kidney, anchovies, asparagus, herring, mushrooms, mussels, and beer.  Limit alcohol intake to no more than 1 drink a day for nonpregnant women and 2 drinks a day for men. One drink equals 12 oz of beer, 5 oz of wine, or 1 oz of hard liquor.  Maintain a healthy weight or lose weight if you are overweight. If you want to lose weight, talk with your health care provider. It is important that you do not lose weight too quickly.  Start or maintain an exercise program as told by your health  care provider.  Drink enough fluids to keep your urine clear or pale yellow.  Take over-the-counter and prescription medicines only as told by your health care provider.  Keep all follow-up visits as told by your health care provider. This is important. Contact a health care provider if:  You have another gout attack.  You continue to have symptoms of a gout attack after10 days of treatment.  You have side effects from your medicines.  You have chills or a fever.  You have burning pain when you urinate.  You have pain in your lower back or belly. Get help right away if:  You have severe or uncontrolled pain.  You cannot urinate. This information is not intended to replace advice given to you by your health care provider. Make sure you discuss any questions you have with your health care provider. Document Released: 01/31/2000 Document Revised: 07/11/2015 Document Reviewed: 11/15/2014 Elsevier Interactive Patient Education  2018 [employer].     IF you received an x-ray today, you will receive an invoice from [facility]. Please contact [facility] at [phone] with questions or concerns regarding your invoice.   IF you received labwork today, you will receive an invoice from [facility]. Please contact [facility] at [phone] with questions or concerns regarding your invoice.   Our billing staff will not be able to assist you with questions regarding bills from these companies.  You will be contacted with the lab results as soon as they are available. The fastest way to get your results is to activate your My Chart account. Instructions are located on the last page of this paperwork. If you have not heard from [place] regarding the results in 2 weeks, please contact this office.      I personally performed the services described in this documentation, which was scribed in my presence. The recorded information has been reviewed and considered for accuracy and  completeness, addended by me as needed, and agree with information above.  Signed,   Meredith Staggers, MD Primary [facility] at [facility].  06/19/17 1:08 PM

## 2017-06-20 LAB — URIC ACID: Uric Acid: 6.1 mg/dL (ref 3.7–8.6)

## 2017-06-24 ENCOUNTER — Ambulatory Visit: Payer: Managed Care, Other (non HMO) | Admitting: Family Medicine

## 2017-07-02 ENCOUNTER — Encounter: Payer: Self-pay | Admitting: Family Medicine

## 2017-07-02 ENCOUNTER — Ambulatory Visit (INDEPENDENT_AMBULATORY_CARE_PROVIDER_SITE_OTHER): Payer: Managed Care, Other (non HMO) | Admitting: Family Medicine

## 2017-07-02 VITALS — BP 131/77 | HR 74 | Temp 98.8°F | Resp 17 | Ht 75.0 in | Wt 274.0 lb

## 2017-07-02 DIAGNOSIS — Z1329 Encounter for screening for other suspected endocrine disorder: Secondary | ICD-10-CM | POA: Diagnosis not present

## 2017-07-02 DIAGNOSIS — M109 Gout, unspecified: Secondary | ICD-10-CM | POA: Diagnosis not present

## 2017-07-02 DIAGNOSIS — R635 Abnormal weight gain: Secondary | ICD-10-CM

## 2017-07-02 DIAGNOSIS — K5900 Constipation, unspecified: Secondary | ICD-10-CM | POA: Diagnosis not present

## 2017-07-02 DIAGNOSIS — R140 Abdominal distension (gaseous): Secondary | ICD-10-CM

## 2017-07-02 NOTE — Progress Notes (Signed)
Subjective:  By signing my name below, I, Stann Ore, attest that this documentation has been prepared under the direction and in the presence of Meredith Staggers, MD. Electronically Signed: Stann Ore, Scribe. 07/02/2017 , 9:27 AM .  Patient was seen in Room 11 .   Patient ID: Adam Alvarez, male    DOB: 1985/12/12, 32 y.o.   MRN: [id number] Chief Complaint  Patient presents with  . Foot Injury    gout   . Obesity   HPI Adam Alvarez is a 32 y.o. male Here for follow up of right great toe pain. Patient was seen on May 4th, symptoms present for 2-3 days at that time. He was treated for likely gout, with indomethacin  TID.   Lab Results  Component Value Date   LABURIC 6.1 06/19/2017  This was drawn at the time of his flare.   Patient was taking indomethacin 3 times a day, but now down to twice a day. He reports having a big improvement with taking indomethacin, though still has intermittent pain.   Weight Gain Wt Readings from Last 3 Encounters:  07/02/17 274 lb (124.3 kg)  06/19/17 272 lb 9.6 oz (123.7 kg)  04/11/17 258 lb (117 kg)   Body mass index is 34.25 kg/m.  Patient noticed weight gain from 230s up to 270s over the past 2-3 months. He's been doing the same work for a while, doing the same amount of driving. After work, he would work out for an hour every day. He informs eating fast food every now and then, up to twice a week. He stopped drinking soda about a month ago. He's tried starting a diet in the past month without any improvement either. He was concerned his weight gain was due to oxybutynin, which he takes for sweating. His weight was 240 a year ago at [facility].   He reports feeling bloated for years, and has taken OTC gas medication. He's been feeling constipation often, but he's been keeping up with his metamucil. He has also been on GoLytely in the past due to persistently constipated. He also takes MiraLAX once a week. He denies seeing GI  for this. He denies chest pain, shortness of breath, blood in stool, or abdominal pain. He denies family history of colon cancer. He denies nausea, vomiting, diarrhea, fever or night sweats.   There are no active problems to display for this patient.  Past Medical History:  Diagnosis Date  . Hyperhydrosis disorder   . Hypertension   . Migraine    Past Surgical History:  Procedure Laterality Date  . JOINT REPLACEMENT    . KNEE SURGERY     No Known Allergies Prior to Admission medications   Medication Sig Start Date End Date Taking? Authorizing Provider  indomethacin (INDOCIN) 50 MG capsule Take 1 capsule (50 mg total) by mouth 3 (three) times daily as needed. Take with food. 06/19/17   Shade Flood, MD  oxybutynin (DITROPAN XL) 10 MG 24 hr tablet Take 1 tablet (10 mg total) by mouth every evening. Take mid-shift 02/03/17   Trena Platt D, PA  oxybutynin (DITROPAN) 5 MG tablet Take 1 tablet (5 mg total) 3 (three) times daily by mouth. 01/02/17   Trena Platt D, PA  SUMAtriptan (IMITREX) 50 MG tablet Take 1 tablet (50 mg total) by mouth every 2 (two) hours as needed for migraine (maximum  in one day). 10/30/10 10/30/11  Eber Hong, MD   Social History   Socioeconomic  History  . Marital status: Married    Spouse name: Not on file  . Number of children: Not on file  . Years of education: Not on file  . Highest education level: Not on file  Occupational History  . Not on file  Social Needs  . Financial resource strain: Not on file  . Food insecurity:    Worry: Not on file    Inability: Not on file  . Transportation needs:    Medical: Not on file    Non-medical: Not on file  Tobacco Use  . Smoking status: Former Games developer  . Smokeless tobacco: Never Used  Substance and Sexual Activity  . Alcohol use: No  . Drug use: No  . Sexual activity: Not on file  Lifestyle  . Physical activity:    Days per week: Not on file    Minutes per session: Not on file  .  Stress: Not on file  Relationships  . Social connections:    Talks on phone: Not on file    Gets together: Not on file    Attends religious service: Not on file    Active member of club or organization: Not on file    Attends meetings of clubs or organizations: Not on file    Relationship status: Not on file  . Intimate partner violence:    Fear of current or ex partner: Not on file    Emotionally abused: Not on file    Physically abused: Not on file    Forced sexual activity: Not on file  Other Topics Concern  . Not on file  Social History Narrative  . Not on file   Review of Systems  Constitutional: Negative for activity change, appetite change, chills, diaphoresis, fatigue, fever and unexpected weight change.  Eyes: Negative for visual disturbance.  Respiratory: Negative for cough, chest tightness and shortness of breath.   Cardiovascular: Negative for chest pain, palpitations and leg swelling.  Gastrointestinal: Positive for abdominal distention and constipation. Negative for abdominal pain, anal bleeding, blood in stool, diarrhea, nausea and vomiting.  Musculoskeletal: Negative for arthralgias, gait problem and joint swelling.  Neurological: Negative for dizziness, light-headedness and headaches.       Objective:   Physical Exam  Constitutional: He is oriented to person, place, and time. He appears well-developed and well-nourished.  HENT:  Head: Normocephalic and atraumatic.  Eyes: Pupils are equal, round, and reactive to light. EOM are normal.  Neck: No JVD present. Carotid bruit is not present.  Cardiovascular: Normal rate, regular rhythm and normal heart sounds.  No murmur heard. Pulmonary/Chest: Effort normal and breath sounds normal. He has no rales.  Musculoskeletal: He exhibits no edema.  Right great toe: good ROM of right first MTP, no appreciable swelling or warmth, non tender; no pedal edema  Neurological: He is alert and oriented to person, place, and time.    Skin: Skin is warm and dry.  Psychiatric: He has a normal mood and affect.  Vitals reviewed.   Vitals:   07/02/17 0842  BP: 131/77  Pulse: 74  Resp: 17  Temp: 98.8 F (37.1 C)  TempSrc: Oral  SpO2: 98%  Weight: 274 lb (124.3 kg)  Height:  (1.905 m)       Assessment & Plan:   Adam Alvarez is a 32 y.o. male Acute gout involving toe of right foot, unspecified cause  -Improved. can taper indomethacin as symptoms continue to improve.  Consider uric acid testing at next visit as  initial borderline reading likely not reliable at time of flare.  Weight gain - Plan: TSH, Comprehensive metabolic panel, Ambulatory referral to Gastroenterology Screening for thyroid disorder - Plan: TSH  -Weight gain noted as above.  He has been exercising more, watching diet, will screen for thyroid disorder initially but TSH was normal in December.  Consider evaluation by medical bariatric specialist if persistent difficulty with weight loss.  However with abdominal symptoms will have evaluated by gastroenterology as well, see below.  Abdominal bloating - Plan: Comprehensive metabolic panel, Ambulatory referral to Gastroenterology Constipation, unspecified constipation type - Plan: Ambulatory referral to Gastroenterology  -Appears to be long-standing issue.  Continue to work on fiber in [employer], healthy eating patterns, fluids/water throughout the day, MiraLAX if needed.  Referred to gastroenterology to decide if further testing needed especially with weight gain as above.  RTC precautions if acute worsening  No orders of the defined types were placed in this encounter.  Patient Instructions   Indomethacin if needed for another week to 10 days if needed but that pain should continue to improve.   We can repeat gout test in few weeks as a more reliable indicator.   See information below on constipation.  MiraLAX over-the-counter if needed, especially if you are unable to have a bowel movement  within 1 at the most 2 days.  Fiber in the diet with whole grains, fruits and vegetables, or fiber supplement as well if needed and drink plenty of water throughout the day.  I will refer you to gastroenterology to discuss the chronic issues with constipation, bloating and weight gain.    For weight gain I will check thyroid testing, electrolytes, but if you are continuing  to have difficulty losing weight, I can refer you to a weight management specialist if needed.  Thank you for coming in today.   Constipation, Adult Constipation is when a person has fewer bowel movements in a week than normal, has difficulty having a bowel movement, or has stools that are dry, hard, or larger than normal. Constipation may be caused by an underlying condition. It may become worse with age if a person takes certain medicines and does not take in enough fluids. Follow these instructions at home: Eating and drinking   Eat foods that have a lot of fiber, such as fresh fruits and vegetables, whole grains, and beans.  Limit foods that are high in fat, low in fiber, or overly processed, such as french fries, hamburgers, cookies, candies, and soda.  Drink enough fluid to keep your urine clear or pale yellow. General instructions  Exercise regularly or as told by your health care provider.  Go to the restroom when you have the urge to go. Do not hold it in.  Take over-the-counter and prescription medicines only as told by your health care provider. These include any fiber supplements.  Practice pelvic floor retraining exercises, such as deep breathing while relaxing the lower abdomen and pelvic floor relaxation during bowel movements.  Watch your condition for any changes.  Keep all follow-up visits as told by your health care provider. This is important. Contact a health care provider if:  You have pain that gets worse.  You have a fever.  You do not have a bowel movement after 4 days.  You  vomit.  You are not hungry.  You lose weight.  You are bleeding from the anus.  You have thin, pencil-like stools. Get help right away if:  You have a fever  and your symptoms suddenly get worse.  You leak stool or have blood in your stool.  Your abdomen is bloated.  You have severe pain in your abdomen.  You feel dizzy or you faint. This information is not intended to replace advice given to you by your health care provider. Make sure you discuss any questions you have with your health care provider. Document Released: 11/01/2003 Document Revised: 08/23/2015 Document Reviewed: 07/24/2015 Elsevier Interactive Patient Education  2018 [employer].   IF you received an x-ray today, you will receive an invoice from [facility]. Please contact [facility] at [phone] with questions or concerns regarding your invoice.   IF you received labwork today, you will receive an invoice from [facility]. Please contact [facility] at [phone] with questions or concerns regarding your invoice.   Our billing staff will not be able to assist you with questions regarding bills from these companies.  You will be contacted with the lab results as soon as they are available. The fastest way to get your results is to activate your My Chart account. Instructions are located on the last page of this paperwork. If you have not heard from [place] regarding the results in 2 weeks, please contact this office.       I personally performed the services described in this documentation, which was scribed in my presence. The recorded information has been reviewed and considered for accuracy and completeness, addended by me as needed, and agree with information above.  Signed,   Meredith Staggers, MD [facility] at [facility].  07/02/17 9:34 AM

## 2017-07-02 NOTE — Patient Instructions (Addendum)
Indomethacin if needed for another week to 10 days if needed but that pain should continue to improve.   We can repeat gout test in few weeks as a more reliable indicator.   See information below on constipation.  MiraLAX over-the-counter if needed, especially if you are unable to have a bowel movement within 1 at the most 2 days.  Fiber in the diet with whole grains, fruits and vegetables, or fiber supplement as well if needed and drink plenty of water throughout the day.  I will refer you to gastroenterology to discuss the chronic issues with constipation, bloating and weight gain.    For weight gain I will check thyroid testing, electrolytes, but if you are continuing  to have difficulty losing weight, I can refer you to a weight management specialist if needed.  Thank you for coming in today.   Constipation, Adult Constipation is when a person has fewer bowel movements in a week than normal, has difficulty having a bowel movement, or has stools that are dry, hard, or larger than normal. Constipation may be caused by an underlying condition. It may become worse with age if a person takes certain medicines and does not take in enough fluids. Follow these instructions at home: Eating and drinking   Eat foods that have a lot of fiber, such as fresh fruits and vegetables, whole grains, and beans.  Limit foods that are high in fat, low in fiber, or overly processed, such as french fries, hamburgers, cookies, candies, and soda.  Drink enough fluid to keep your urine clear or pale yellow. General instructions  Exercise regularly or as told by your health care provider.  Go to the restroom when you have the urge to go. Do not hold it in.  Take over-the-counter and prescription medicines only as told by your health care provider. These include any fiber supplements.  Practice pelvic floor retraining exercises, such as deep breathing while relaxing the lower abdomen and pelvic floor relaxation  during bowel movements.  Watch your condition for any changes.  Keep all follow-up visits as told by your health care provider. This is important. Contact a health care provider if:  You have pain that gets worse.  You have a fever.  You do not have a bowel movement after 4 days.  You vomit.  You are not hungry.  You lose weight.  You are bleeding from the anus.  You have thin, pencil-like stools. Get help right away if:  You have a fever and your symptoms suddenly get worse.  You leak stool or have blood in your stool.  Your abdomen is bloated.  You have severe pain in your abdomen.  You feel dizzy or you faint. This information is not intended to replace advice given to you by your health care provider. Make sure you discuss any questions you have with your health care provider. Document Released: 11/01/2003 Document Revised: 08/23/2015 Document Reviewed: 07/24/2015 Elsevier Interactive Patient Education  2018 [employer].   IF you received an x-ray today, you will receive an invoice from [facility]. Please contact [facility] at [phone] with questions or concerns regarding your invoice.   IF you received labwork today, you will receive an invoice from [facility]. Please contact [facility] at [phone] with questions or concerns regarding your invoice.   Our billing staff will not be able to assist you with questions regarding bills from these companies.  You will be contacted with the lab results as soon as they are  available. The fastest way to get your results is to activate your My Chart account. Instructions are located on the last page of this paperwork. If you have not heard from us regarding the results in 2 weeks, please contact this office.      

## 2017-07-03 LAB — COMPREHENSIVE METABOLIC PANEL
ALBUMIN: 4.5 g/dL (ref 3.5–5.5)
ALK PHOS: 71 IU/L (ref 39–117)
ALT: 38 IU/L (ref 0–44)
AST: 23 IU/L (ref 0–40)
Albumin/Globulin Ratio: 1.9 (ref 1.2–2.2)
BUN / CREAT RATIO: 14 (ref 9–20)
BUN: 14 mg/dL (ref 6–20)
Bilirubin Total: 0.7 mg/dL (ref 0.0–1.2)
CO2: 20 mmol/L (ref 20–29)
CREATININE: 1.03 mg/dL (ref 0.76–1.27)
Calcium: 9.4 mg/dL (ref 8.7–10.2)
Chloride: 102 mmol/L (ref 96–106)
GFR calc Af Amer: 112 mL/min/{1.73_m2} (ref >59)
GFR calc non Af Amer: 97 mL/min/{1.73_m2} (ref >59)
GLUCOSE: 89 mg/dL (ref 65–99)
Globulin, Total: 2.4 g/dL (ref 1.5–4.5)
Potassium: 4.4 mmol/L (ref 3.5–5.2)
Sodium: 140 mmol/L (ref 134–144)
Total Protein: 6.9 g/dL (ref 6.0–8.5)

## 2017-07-03 LAB — TSH: TSH: 2.07 u[IU]/mL (ref 0.450–4.500)

## 2017-07-08 ENCOUNTER — Telehealth: Payer: Self-pay

## 2017-07-08 NOTE — Telephone Encounter (Signed)
Copied from CRM [id number]. Topic: General - Other >> Jul 08, 2017 12:23 PM Alvarez, Adam wrote: Reason for CRM: Pt states he spoke with Dr Chilton Si about his weight gain during his last visit and he would like to be prescribed a medication for weight loss. Pt states he has removed sweets from his diet along with not eating any breads but he is still consistently gaining weight. Pt states he works out twice daily along with drinking about a gallon and a half of water with low sodium intake. Cb# [phone]

## 2017-07-09 NOTE — Telephone Encounter (Signed)
Pt advised to give this 1-2 months of diet and exercise before evaluation of weight loss medication.

## 2017-08-05 ENCOUNTER — Telehealth: Payer: Self-pay | Admitting: Physician Assistant

## 2017-08-05 NOTE — Telephone Encounter (Signed)
[facility] has contacted and left pt two messages with no response.  Pt can call and schedule if he still wants to be seen

## 2017-08-30 ENCOUNTER — Telehealth: Payer: Self-pay | Admitting: Family Medicine

## 2017-08-30 NOTE — Telephone Encounter (Signed)
Copied from CRM [id number]. Topic: Quick Communication - Rx Refill/Question >> Aug 30, 2017  9:31 AM Adam Alvarez, Adam Alvarez wrote: Medication: oxybutynin (DITROPAN) 5 MG tablet   Has the patient contacted their pharmacy? {no Preferred Pharmacy (with phone number or street name): [facility] [address] - [place] ([address]), [place] - [address] [phone] (Phone) [phone] (Fax)      Agent: Please be advised that RX refills may take up to 3 business days. We ask that you follow-up with your pharmacy.

## 2017-08-30 NOTE — Telephone Encounter (Signed)
Message sent to scheduling pool.  Ptneeds appt to establish with another provider. [place] patient

## 2017-09-02 ENCOUNTER — Encounter: Payer: Self-pay | Admitting: Emergency Medicine

## 2017-09-02 ENCOUNTER — Ambulatory Visit (INDEPENDENT_AMBULATORY_CARE_PROVIDER_SITE_OTHER): Payer: Managed Care, Other (non HMO) | Admitting: Emergency Medicine

## 2017-09-02 ENCOUNTER — Other Ambulatory Visit: Payer: Self-pay

## 2017-09-02 DIAGNOSIS — L749 Eccrine sweat disorder, unspecified: Secondary | ICD-10-CM

## 2017-09-02 MED ORDER — OXYBUTYNIN CHLORIDE 5 MG PO TABS: 5.0000 mg | ORAL_TABLET | Freq: Three times a day (TID) | ORAL | 3 refills | Status: AC

## 2017-09-02 NOTE — Progress Notes (Signed)
Adam Alvarez 32 y.o.   No chief complaint on file.   HISTORY OF PRESENT ILLNESS: This is a 32 y.o. male has a history of excessive sweating on Ditropan.  Needs medication refill.  No other complaints or medical concerns.  HPI   Prior to Admission medications   Medication Sig Start Date End Date Taking? Authorizing Provider  indomethacin (INDOCIN) 50 MG capsule Take 1 capsule (50 mg total) by mouth 3 (three) times daily as needed. Take with food. 06/19/17  Yes Shade Flood, MD  oxybutynin (DITROPAN) 5 MG tablet Take 1 tablet (5 mg total) 3 (three) times daily by mouth. 01/02/17  Yes English, Stephanie D, PA    No Known Allergies  There are no active problems to display for this patient.   Past Medical History:  Diagnosis Date  . Hyperhydrosis disorder   . Hypertension   . Migraine     Past Surgical History:  Procedure Laterality Date  . JOINT REPLACEMENT    . KNEE SURGERY      Social History   Socioeconomic History  . Marital status: Married    Spouse name: Not on file  . Number of children: Not on file  . Years of education: Not on file  . Highest education level: Not on file  Occupational History  . Not on file  Social Needs  . Financial resource strain: Not on file  . Food insecurity:    Worry: Not on file    Inability: Not on file  . Transportation needs:    Medical: Not on file    Non-medical: Not on file  Tobacco Use  . Smoking status: Former Games developer  . Smokeless tobacco: Never Used  Substance and Sexual Activity  . Alcohol use: No  . Drug use: No  . Sexual activity: Not on file  Lifestyle  . Physical activity:    Days per week: Not on file    Minutes per session: Not on file  . Stress: Not on file  Relationships  . Social connections:    Talks on phone: Not on file    Gets together: Not on file    Attends religious service: Not on file    Active member of club or organization: Not on file    Attends meetings of clubs or  organizations: Not on file    Relationship status: Not on file  . Intimate partner violence:    Fear of current or ex partner: Not on file    Emotionally abused: Not on file    Physically abused: Not on file    Forced sexual activity: Not on file  Other Topics Concern  . Not on file  Social History Narrative  . Not on file    No family history on file.   Review of Systems  Constitutional: Negative.  Negative for chills and fever.  Respiratory: Negative for shortness of breath.   Cardiovascular: Negative for chest pain.  Gastrointestinal: Negative for abdominal pain, nausea and vomiting.  Skin: Negative.  Negative for rash.  Neurological: Negative for dizziness and headaches.  Endo/Heme/Allergies: Negative.   All other systems reviewed and are negative.  Vitals:   09/02/17 1222  BP: (!) 145/83  Pulse: 78  Temp: 98.5 F (36.9 C)  SpO2: 95%     Physical Exam  Constitutional: He appears well-developed and well-nourished.  HENT:  Head: Normocephalic.  Eyes: Pupils are equal, round, and reactive to light. EOM are normal.  Neck: Normal range of motion.  Cardiovascular: Normal rate.  Pulmonary/Chest: Effort normal.  Musculoskeletal: Normal range of motion.  Neurological: He is alert.  Skin: Skin is warm and dry.  Psychiatric: He has a normal mood and affect. His behavior is normal.  Vitals reviewed.    ASSESSMENT & PLAN: Adam LightningFrankie was seen today for medication refill.  Diagnoses and all orders for this visit:  Sweating abnormality -     oxybutynin (DITROPAN) 5 MG tablet; Take 1 tablet (5 mg total) by mouth 3 (three) times daily.   Patient Instructions       IF you received an x-ray today, you will receive an invoice from [facility]. Please contact [facility] at [phone] with questions or concerns regarding your invoice.   IF you received labwork today, you will receive an invoice from [facility]. Please contact [facility] at [phone]  with questions or concerns regarding your invoice.   Our billing staff will not be able to assist you with questions regarding bills from these companies.  You will be contacted with the lab results as soon as they are available. The fastest way to get your results is to activate your My Chart account. Instructions are located on the last page of this paperwork. If you have not heard from us regarding the results in 2 weeks, please contact this office.     Hyperhidrosis It is normal to sweat when you are hot, being physically active, or feeling anxious. Sweating is a necessary function for your body. However, hyperhidrosis is when you sweat too much (excessively). Although hyperhidrosis is not dangerous, it can make you feel embarrassed. There are two kinds of hyperhidrosis:  Primary hyperhidrosis. The sweating usually localizes in one part of your body, such as your underarms, or in a few areas, such as your feet, face, armpits, and hands. This is the more common kind of hyperhidrosis.  Secondary hyperhidrosis. This type more likely affects your entire body.  What are the causes? The cause of your hyperhidrosis depends on the kind you have.  Primary hyperhidrosis may be caused by having sweat glands that are more active than normal.  Secondary hyperhidrosis is caused by an underlying condition. Possible conditions include: ? Diabetes. ? Gout. ? Certain medicines. ? Anxiety. ? Stroke. ? Obesity. ? Menopause. ? Overactive thyroid (hyperthyroidism). ? Tumors. ? Frostbite. ? Certain types of cancers. ? Alcoholism. ? Injury to your nervous system. ? Stroke. ? Parkinson disease.  What increases the risk? You may be at an increased risk for primary hyperhidrosis if you have a family history of it. What are the signs or symptoms? General symptoms of hyperhidrosis may include:  Feeling like you are sweating constantly, even while you are resting.  Having skin that peels or gets  paler or softer in the areas where you sweat the most.  Being able to see sweat on your skin.  Symptoms of primary hyperhidrosis may include:  Sweating in specific areas, such as your armpits, palms, feet, and face.  Sweating in the same location on both sides of your body.  Sweating only during the day.  Symptoms of secondary hyperhidrosis may include:  Sweating all over your body.  Sweating even while you sleep.  How is this diagnosed? Hyperhidrosis may be diagnosed by:  Medical history and physical exam.  Testing, such as: ? Sweat test. ? Paper test.  How is this treated? Your treatment will depend on the kind of hyperhidrosis you have and the parts of your body that are affected. If your hyperhidrosis  is caused by an underlying condition, your treatment will address the cause. Treatment may include:  Strong antiperspirants. Your health care provider may give you a prescription.  Medicines taken by mouth.  Medicines injected by your health care provider. These may include small amounts of botulinum toxin.  Iontophoresis. This is a procedure that temporarily turns off the sweat glands in your hands and feet.  Surgery to remove your sweat glands.  Sympathectomy. This is a procedure that cuts or destroys your nerves so that they do not send a signal to sweat.  Follow these instructions at home:  Take medicines only as directed by your health care provider.  Use antiperspirants as directed by your health care provider.  Limit or avoid foods or beverages that seem to increase your chances of sweating, such as: ? Spicy food. ? Caffeine. ? Alcohol. ? Foods that contain MSG.  If your feet sweat: ? Wear sandals, when possible. ? Do not wear cotton socks. Wear socks that remove or wick moisture from your feet. ? Wear leather shoes. ? Avoid wearing the same pair of shoes two days in a row.  Consider joining a hyperhidrosis support group. Contact a health care  provider if:  You have new symptoms.  Your symptoms get worse. This information is not intended to replace advice given to you by your health care provider. Make sure you discuss any questions you have with your health care provider. Document Released: 04/03/2005 Document Revised: 07/11/2015 Document Reviewed: 09/12/2013 Elsevier Interactive Patient Education  2018 Elsevier Inc.      Edwina Barth, MD [facility]

## 2017-09-02 NOTE — Patient Instructions (Addendum)
IF you received an x-ray today, you will receive an invoice from [facility]. Please contact [facility] at [phone] with questions or concerns regarding your invoice.   IF you received labwork today, you will receive an invoice from [facility]. Please contact [facility] at [phone] with questions or concerns regarding your invoice.   Our billing staff will not be able to assist you with questions regarding bills from these companies.  You will be contacted with the lab results as soon as they are available. The fastest way to get your results is to activate your My Chart account. Instructions are located on the last page of this paperwork. If you have not heard from [place] regarding the results in 2 weeks, please contact this office.     Hyperhidrosis It is normal to sweat when you are hot, being physically active, or feeling anxious. Sweating is a necessary function for your body. However, hyperhidrosis is when you sweat too much (excessively). Although hyperhidrosis is not dangerous, it can make you feel embarrassed. There are two kinds of hyperhidrosis:  Primary hyperhidrosis. The sweating usually localizes in one part of your body, such as your underarms, or in a few areas, such as your feet, face, armpits, and hands. This is the more common kind of hyperhidrosis.  Secondary hyperhidrosis. This type more likely affects your entire body.  What are the causes? The cause of your hyperhidrosis depends on the kind you have.  Primary hyperhidrosis may be caused by having sweat glands that are more active than normal.  Secondary hyperhidrosis is caused by an underlying condition. Possible conditions include: ? Diabetes. ? Gout. ? Certain medicines. ? Anxiety. ? Stroke. ? Obesity. ? Menopause. ? Overactive thyroid (hyperthyroidism). ? Tumors. ? Frostbite. ? Certain types of cancers. ? Alcoholism. ? Injury to your nervous system. ? Stroke. ? Parkinson  disease.  What increases the risk? You may be at an increased risk for primary hyperhidrosis if you have a family history of it. What are the signs or symptoms? General symptoms of hyperhidrosis may include:  Feeling like you are sweating constantly, even while you are resting.  Having skin that peels or gets paler or softer in the areas where you sweat the most.  Being able to see sweat on your skin.  Symptoms of primary hyperhidrosis may include:  Sweating in specific areas, such as your armpits, palms, feet, and face.  Sweating in the same location on both sides of your body.  Sweating only during the day.  Symptoms of secondary hyperhidrosis may include:  Sweating all over your body.  Sweating even while you sleep.  How is this diagnosed? Hyperhidrosis may be diagnosed by:  Medical history and physical exam.  Testing, such as: ? Sweat test. ? Paper test.  How is this treated? Your treatment will depend on the kind of hyperhidrosis you have and the parts of your body that are affected. If your hyperhidrosis is caused by an underlying condition, your treatment will address the cause. Treatment may include:  Strong antiperspirants. Your health care provider may give you a prescription.  Medicines taken by mouth.  Medicines injected by your health care provider. These may include small amounts of botulinum toxin.  Iontophoresis. This is a procedure that temporarily turns off the sweat glands in your hands and feet.  Surgery to remove your sweat glands.  Sympathectomy. This is a procedure that cuts or destroys your nerves so that they do not send a signal to sweat.  Follow these instructions at home:  Take medicines only as directed by your health care provider.  Use antiperspirants as directed by your health care provider.  Limit or avoid foods or beverages that seem to increase your chances of sweating, such as: ? Spicy  food. ? Caffeine. ? Alcohol. ? Foods that contain MSG.  If your feet sweat: ? Wear sandals, when possible. ? Do not wear cotton socks. Wear socks that remove or wick moisture from your feet. ? Wear leather shoes. ? Avoid wearing the same pair of shoes two days in a row.  Consider joining a hyperhidrosis support group. Contact a health care provider if:  You have new symptoms.  Your symptoms get worse. This information is not intended to replace advice given to you by your health care provider. Make sure you discuss any questions you have with your health care provider. Document Released: 04/03/2005 Document Revised: 07/11/2015 Document Reviewed: 09/12/2013 Elsevier Interactive Patient Education  [employer]2018 Elsevier Inc.

## 2017-12-17 ENCOUNTER — Ambulatory Visit: Payer: Self-pay | Admitting: *Deleted

## 2017-12-17 DIAGNOSIS — R61 Generalized hyperhidrosis: Secondary | ICD-10-CM | POA: Insufficient documentation

## 2017-12-17 NOTE — Telephone Encounter (Signed)
Pt called with complaints of nausea, vomiting, and diarrhea which started 12/16/17; he is most concerned about stabbing abdominal pain which started 12/16/17 at 1700; the pt also states that he ate 3 large honey crisp apples that have been recalled for listeria; recommendations made per nurse triage protocol; the pt is normally seen at [facility]; they have no availability within the time constraints per protocol; the pt states that he will go to ED.  Reason for Disposition . [1] Vomiting AND [2] abdomen looks much more swollen than usual  Answer Assessment - Initial Assessment Questions 1. LOCATION: "Where does it hurt?"      Lower right abdomen 2. RADIATION: "Does the pain shoot anywhere else?" (e.g., chest, back)     no 3. ONSET: "When did the pain begin?" (Minutes, hours or days ago)      12/16/17 at 1700 4. SUDDEN: "Gradual or sudden onset?"    sudden 5. PATTERN "Does the pain come and go, or is it constant?"    - If constant: "Is it getting better, staying the same, or worsening?"      (Note: Constant means the pain never goes away completely; most serious pain is constant and it progresses)     - If intermittent: "How long does it last?" "Do you have pain now?"     (Note: Intermittent means the pain goes away completely between bouts)     constant 6. SEVERITY: "How bad is the pain?"  (e.g., Scale 1-10; mild, moderate, or severe)    - MILD (1-3): doesn't interfere with normal activities, abdomen soft and not tender to touch     - MODERATE (4-7): interferes with normal activities or awakens from sleep, tender to touch     - SEVERE (8-10): excruciating pain, doubled over, unable to do any normal activities       Moderate to severe 7. RECURRENT SYMPTOM: "Have you ever had this type of abdominal pain before?" If so, ask: "When was the last time?" and "What happened that time?"      no 8. CAUSE: "What do you think is causing the abdominal pain?"     Ate apples that were recalled for listeria  (2 days ago) 9. RELIEVING/AGGRAVATING FACTORS: "What makes it better or worse?" (e.g., movement, antacids, bowel movement)     no 10. OTHER SYMPTOMS: "Has there been any vomiting, diarrhea, constipation, or urine problems?"       Vomiting, diarrhea  Protocols used: ABDOMINAL PAIN - MALE-A-AH

## 2017-12-28 ENCOUNTER — Encounter: Payer: Managed Care, Other (non HMO) | Admitting: Physician Assistant

## 2018-04-07 ENCOUNTER — Ambulatory Visit (INDEPENDENT_AMBULATORY_CARE_PROVIDER_SITE_OTHER): Payer: Managed Care, Other (non HMO) | Admitting: Family Medicine

## 2018-04-07 ENCOUNTER — Other Ambulatory Visit: Payer: Self-pay

## 2018-04-07 ENCOUNTER — Ambulatory Visit (INDEPENDENT_AMBULATORY_CARE_PROVIDER_SITE_OTHER): Payer: Managed Care, Other (non HMO)

## 2018-04-07 ENCOUNTER — Encounter: Payer: Self-pay | Admitting: Family Medicine

## 2018-04-07 VITALS — BP 136/83 | HR 74 | Temp 98.7°F | Resp 16 | Ht 75.0 in | Wt 285.4 lb

## 2018-04-07 DIAGNOSIS — R635 Abnormal weight gain: Secondary | ICD-10-CM | POA: Diagnosis not present

## 2018-04-07 DIAGNOSIS — K5900 Constipation, unspecified: Secondary | ICD-10-CM | POA: Diagnosis not present

## 2018-04-07 DIAGNOSIS — R1030 Lower abdominal pain, unspecified: Secondary | ICD-10-CM | POA: Diagnosis not present

## 2018-04-07 LAB — POCT URINALYSIS DIP (MANUAL ENTRY)
BILIRUBIN UA: NEGATIVE
Blood, UA: NEGATIVE
Glucose, UA: NEGATIVE mg/dL
Ketones, POC UA: NEGATIVE mg/dL
Leukocytes, UA: NEGATIVE
Nitrite, UA: NEGATIVE
PH UA: 5.5 (ref 5.0–8.0)
Protein Ur, POC: NEGATIVE mg/dL
UROBILINOGEN UA: 0.2 U/dL

## 2018-04-07 LAB — POCT CBC
GRANULOCYTE PERCENT: 58.5 % (ref 37–80)
HCT, POC: 43.6 % — AB (ref 29–41)
HEMOGLOBIN: 14.7 g/dL — AB (ref 11–14.6)
Lymph, poc: 1 (ref 0.6–3.4)
MCH: 29.6 pg (ref 27–31.2)
MCHC: 33.8 g/dL (ref 31.8–35.4)
MCV: 87.5 fL (ref 76–111)
MID (CBC): 0.5 (ref 0–0.9)
MPV: 7.5 fL (ref 0–99.8)
PLATELET COUNT, POC: 224 10*3/uL (ref 142–424)
POC Granulocyte: 2.2 (ref 2–6.9)
POC LYMPH PERCENT: 28.1 %L (ref 10–50)
POC MID %: 13.4 % — AB (ref 0–12)
RBC: 4.98 M/uL (ref 4.69–6.13)
RDW, POC: 13.7 %
WBC: 3.7 10*3/uL — AB (ref 4.6–10.2)

## 2018-04-07 LAB — POC MICROSCOPIC URINALYSIS (UMFC): Mucus: ABSENT

## 2018-04-07 NOTE — Patient Instructions (Addendum)
Based on your symptoms and exam today I am suspicious for constipation contributing to some of the abdominal pain, cramping symptoms.  Try MiraLAX twice per day, see other information on constipation as listed below.  If abdominal pain is not improving with increased bowel movements next day or two or worsening symptoms such as nausea, vomiting, fevers, or other worsening, return for recheck here or emergency room.   I did check thyroid test for weight gain but would like to follow-up with you next few weeks to discuss that further  Constipation, Adult Constipation is when a person has fewer bowel movements in a week than normal, has difficulty having a bowel movement, or has stools that are dry, hard, or larger than normal. Constipation may be caused by an underlying condition. It may become worse with age if a person takes certain medicines and does not take in enough fluids. Follow these instructions at home: Eating and drinking   Eat foods that have a lot of fiber, such as fresh fruits and vegetables, whole grains, and beans.  Limit foods that are high in fat, low in fiber, or overly processed, such as french fries, hamburgers, cookies, candies, and soda.  Drink enough fluid to keep your urine clear or pale yellow. General instructions  Exercise regularly or as told by your health care provider.  Go to the restroom when you have the urge to go. Do not hold it in.  Take over-the-counter and prescription medicines only as told by your health care provider. These include any fiber supplements.  Practice pelvic floor retraining exercises, such as deep breathing while relaxing the lower abdomen and pelvic floor relaxation during bowel movements.  Watch your condition for any changes.  Keep all follow-up visits as told by your health care provider. This is important. Contact a health care provider if:  You have pain that gets worse.  You have a fever.  You do not have a bowel movement  after 4 days.  You vomit.  You are not hungry.  You lose weight.  You are bleeding from the anus.  You have thin, pencil-like stools. Get help right away if:  You have a fever and your symptoms suddenly get worse.  You leak stool or have blood in your stool.  Your abdomen is bloated.  You have severe pain in your abdomen.  You feel dizzy or you faint. This information is not intended to replace advice given to you by your health care provider. Make sure you discuss any questions you have with your health care provider. Document Released: 11/01/2003 Document Revised: 08/23/2015 Document Reviewed: 07/24/2015 Elsevier Interactive Patient Education  [employer].    If you have lab work done today you will be contacted with your lab results within the next 2 weeks.  If you have not heard from [place] then please contact us. The fastest way to get your results is to register for My Chart.   IF you received an x-ray today, you will receive an invoice from [facility]. Please contact [facility] at [phone] with questions or concerns regarding your invoice.   IF you received labwork today, you will receive an invoice from [place]. Please contact [facility] at [phone] with questions or concerns regarding your invoice.   Our billing staff will not be able to assist you with questions regarding bills from these companies.  You will be contacted with the lab results as soon as they are available. The fastest way to get your results  is to activate your My Chart account. Instructions are located on the last page of this paperwork. If you have not heard from us regarding the results in 2 weeks, please contact this office.     

## 2018-04-07 NOTE — Progress Notes (Signed)
Subjective:    Patient ID: TRI NOWLING, male    DOB: 10/16/1985, 33 y.o.   MRN: [id number]  HPI DVANTE ROGIER is a 33 y.o. male Presents today for: Chief Complaint  Patient presents with  . Abdominal Pain    lower abd and lower back pain. Hx of diverticulitis   Has had some continued abd pain for past 2-3 months, mid lower and pain into left lower back. 2-3 months.  No fever/chills/nausea/vomiting.  No hematuria,dysuria, frequency urgency.  No hist kidney stones.  Constantly bloated.  Constipated usually, but had BM this morning (hot water and benefiber).  Normal BM this morning, 2 small firm stools.  - some pressure relief in stomach, but sore again after an hour two. Prior BM 2 days prior. Does feel like BM helps symptoms.  Feels more pain past 72 hours, but still no fever/n/v.  Drives truck. Trying to eat cabbage diet past 7 days for attempted wt loss.   Went to urgent care about 2 months ago - ? Diverticulitis, but no meds prescribed. Had xray - no apparent concerns. Unable to see their records. No history of colonoscopy.   Weight gain past year - 65# past year and a half.   Tx: oxybutynin for sweating - past 4 years, but weaned to as needed - once per week.    There are no active problems to display for this patient.  Past Medical History:  Diagnosis Date  . Hyperhydrosis disorder   . Hypertension   . Migraine    Past Surgical History:  Procedure Laterality Date  . JOINT REPLACEMENT    . KNEE SURGERY     No Known Allergies Prior to Admission medications   Medication Sig Start Date End Date Taking? Authorizing Provider  OXYBUTYNIN CHLORIDE PO Take by mouth.    [provider]   Social History   Socioeconomic History  . Marital status: Married    Spouse name: Not on file  . Number of children: Not on file  . Years of education: Not on file  . Highest education level: Not on file  Occupational History  . Not on file  Social Needs  .  Financial resource strain: Not on file  . Food insecurity:    Worry: Not on file    Inability: Not on file  . Transportation needs:    Medical: Not on file    Non-medical: Not on file  Tobacco Use  . Smoking status: Former Games developer  . Smokeless tobacco: Never Used  Substance and Sexual Activity  . Alcohol use: No  . Drug use: No  . Sexual activity: Not on file  Lifestyle  . Physical activity:    Days per week: Not on file    Minutes per session: Not on file  . Stress: Not on file  Relationships  . Social connections:    Talks on phone: Not on file    Gets together: Not on file    Attends religious service: Not on file    Active member of club or organization: Not on file    Attends meetings of clubs or organizations: Not on file    Relationship status: Not on file  . Intimate partner violence:    Fear of current or ex partner: Not on file    Emotionally abused: Not on file    Physically abused: Not on file    Forced sexual activity: Not on file  Other Topics Concern  . Not on  file  Social History Narrative  . Not on file    Review of Systems Per HPI.     Objective:   Physical Exam Vitals signs reviewed.  Constitutional:      General: He is not in acute distress.    Appearance: He is well-developed.  HENT:     Head: Normocephalic and atraumatic.  Eyes:     Pupils: Pupils are equal, round, and reactive to light.  Neck:     Vascular: No carotid bruit or JVD.  Cardiovascular:     Rate and Rhythm: Normal rate and regular rhythm.     Heart sounds: Normal heart sounds. No murmur.  Pulmonary:     Effort: Pulmonary effort is normal.     Breath sounds: Normal breath sounds. No rales.  Abdominal:     Tenderness: There is abdominal tenderness in the periumbilical area, suprapubic area and left lower quadrant. There is no right CVA tenderness, left CVA tenderness, guarding or rebound. Negative signs include Murphy's sign and McBurney's sign.  Musculoskeletal:     Lumbar  back: He exhibits tenderness (min to lower parasoinals, no cvat, no midline bony ttp. ). He exhibits normal range of motion and no bony tenderness.  Skin:    General: Skin is warm and dry.  Neurological:     Mental Status: He is alert and oriented to person, place, and time.    Vitals:   04/07/18 1508  BP: 136/83  Pulse: 74  Resp: 16  Temp: 98.7 F (37.1 C)  TempSrc: Oral  SpO2: 97%  Weight: 285 lb 6.4 oz (129.5 kg)  Height: 6\' 3"  (1.905 m)    Results for orders placed or performed in visit on 04/07/18  POCT CBC  Result Value Ref Range   WBC 3.7 (A) 4.6 - 10.2 K/uL   Lymph, poc 1.0 0.6 - 3.4   POC LYMPH PERCENT 28.1 10 - 50 %L   MID (cbc) 0.5 0 - 0.9   POC MID % 13.4 (A) 0 - 12 %M   POC Granulocyte 2.2 2 - 6.9   Granulocyte percent 58.5 37 - 80 %G   RBC 4.98 4.69 - 6.13 M/uL   Hemoglobin 14.7 (A) 11 - 14.6 g/dL   HCT, POC 40.9 (A) 29 - 41 %   MCV 87.5 76 - 111 fL   MCH, POC 29.6 27 - 31.2 pg   MCHC 33.8 31.8 - 35.4 g/dL   RDW, POC 81.1 %   Platelet Count, POC 224 142 - 424 K/uL   MPV 7.5 0 - 99.8 fL  POCT urinalysis dipstick  Result Value Ref Range   Color, UA yellow yellow   Clarity, UA clear clear   Glucose, UA negative negative mg/dL   Bilirubin, UA negative negative   Ketones, POC UA negative negative mg/dL   Spec Grav, UA >=9.147 (A) 1.010 - 1.025   Blood, UA negative negative   pH, UA 5.5 5.0 - 8.0   Protein Ur, POC negative negative mg/dL   Urobilinogen, UA 0.2 0.2 or 1.0 E.U./dL   Nitrite, UA Negative Negative   Leukocytes, UA Negative Negative  POCT Microscopic Urinalysis ([facility])  Result Value Ref Range   WBC,UR,HPF,POC None None WBC/hpf   RBC,UR,HPF,POC None None RBC/hpf   Bacteria None None, Too numerous to count   Mucus Absent Absent   Epithelial Cells, UR Per Microscopy None None, Too numerous to count cells/hpf  Dg Abd 1 View  Result Date: 04/07/2018 CLINICAL DATA:  Left-sided lower  abdominal pain. EXAM: ABDOMEN - 1 VIEW COMPARISON:   Radiographs dated 04/06/2017 FINDINGS: The bowel gas pattern is normal. No radio-opaque calculi or other significant radiographic abnormality are seen. IMPRESSION: Benign-appearing abdomen. Electronically Signed   By: Francene Boyers M.D.   On: 04/07/2018 16:38      Assessment & Plan:  . Issaiah Seabrooks Leser is a 33 y.o. male Lower abdominal pain - Plan: POCT CBC, POCT urinalysis dipstick, DG Abd 1 View, POCT Microscopic Urinalysis ([facility])  Constipation, unspecified constipation type  Weight gain - Plan: TSH  Suspected constipation based on history.  CT in February 2019 without acute findings nor mention of diverticulosis/diverticulitis at that time, no known history or diverticulosis.   -No acute findings on abdominal x-ray, suspected increased stool on initial review of images.  Reassuring CBC, urinalysis and afebrile.  -Trial of MiraLAX over-the-counter 1-2 times per day, handout given on constipation.   -Does not appear to have need for acute imaging with CT at this time but RTC/ER precautions given.    - Follow-up if not improving in the next 48 to 72 hours   -Reports weight gain over the past year to year and a half.  Check TSH.  Follow-up to discuss further.   No orders of the defined types were placed in this encounter.  Patient Instructions   Based on your symptoms and exam today I am suspicious for constipation contributing to some of the abdominal pain, cramping symptoms.  Try MiraLAX twice per day, see other information on constipation as listed below.  If abdominal pain is not improving with increased bowel movements next day or two or worsening symptoms such as nausea, vomiting, fevers, or other worsening, return for recheck here or emergency room.   I did check thyroid test for weight gain but would like to follow-up with you next few weeks to discuss that further  Constipation, Adult Constipation is when a person has fewer bowel movements in a week than normal, has difficulty  having a bowel movement, or has stools that are dry, hard, or larger than normal. Constipation may be caused by an underlying condition. It may become worse with age if a person takes certain medicines and does not take in enough fluids. Follow these instructions at home: Eating and drinking   Eat foods that have a lot of fiber, such as fresh fruits and vegetables, whole grains, and beans.  Limit foods that are high in fat, low in fiber, or overly processed, such as french fries, hamburgers, cookies, candies, and soda.  Drink enough fluid to keep your urine clear or pale yellow. General instructions  Exercise regularly or as told by your health care provider.  Go to the restroom when you have the urge to go. Do not hold it in.  Take over-the-counter and prescription medicines only as told by your health care provider. These include any fiber supplements.  Practice pelvic floor retraining exercises, such as deep breathing while relaxing the lower abdomen and pelvic floor relaxation during bowel movements.  Watch your condition for any changes.  Keep all follow-up visits as told by your health care provider. This is important. Contact a health care provider if:  You have pain that gets worse.  You have a fever.  You do not have a bowel movement after 4 days.  You vomit.  You are not hungry.  You lose weight.  You are bleeding from the anus.  You have thin, pencil-like stools. Get help right away if:  You have a fever and your symptoms suddenly get worse.  You leak stool or have blood in your stool.  Your abdomen is bloated.  You have severe pain in your abdomen.  You feel dizzy or you faint. This information is not intended to replace advice given to you by your health care provider. Make sure you discuss any questions you have with your health care provider. Document Released: 11/01/2003 Document Revised: 08/23/2015 Document Reviewed: 07/24/2015 Elsevier  Interactive Patient Education  [employer]2019 [employer]    If you have lab work done today you will be contacted with your lab results within the next 2 weeks.  If you have not heard from us then please contact us. The fastest way to get your results is to register for My Chart.   IF you received an x-ray today, you will receive an invoice from [facility]. Please contact [facility] at [phone] with questions or concerns regarding your invoice.   IF you received labwork today, you will receive an invoice from [facility]. Please contact [facility] at [phone] with questions or concerns regarding your invoice.   Our billing staff will not be able to assist you with questions regarding bills from these companies.  You will be contacted with the lab results as soon as they are available. The fastest way to get your results is to activate your My Chart account. Instructions are located on the last page of this paperwork. If you have not heard from us regarding the results in 2 weeks, please contact this office.       Signed,   Meredith StaggersJeffrey Artez Regis, MD Primary Care at [facility].  04/07/18 4:54 PM

## 2018-04-08 LAB — TSH: TSH: 1.45 u[IU]/mL (ref 0.450–4.500)

## 2018-05-02 ENCOUNTER — Encounter: Payer: Self-pay | Admitting: Family Medicine

## 2018-05-02 ENCOUNTER — Ambulatory Visit: Payer: Managed Care, Other (non HMO) | Admitting: Family Medicine

## 2018-05-02 ENCOUNTER — Other Ambulatory Visit: Payer: Self-pay

## 2018-05-02 ENCOUNTER — Ambulatory Visit: Payer: Self-pay | Admitting: Family Medicine

## 2018-05-02 VITALS — BP 118/82 | HR 82 | Resp 16 | Ht 75.0 in | Wt 289.6 lb

## 2018-05-02 DIAGNOSIS — Z024 Encounter for examination for driving license: Secondary | ICD-10-CM

## 2018-05-02 NOTE — Progress Notes (Signed)
Subjective:    Patient ID: Adam Alvarez, male    DOB: [birth year], 33 y.o.   MRN: [id number]  HPI Adam Alvarez is a 33 y.o. male Presents today for: Chief Complaint  Patient presents with  . 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 No side effects, not lightheaded/dizzy.  Prior card 2 years.   No heart problems, lung disorders.  No hx of OSA, no daytime sleepiness, no snoring.  No msk weakness, deficits.   Wears glasses, no glaucoma.aphakia or glare difficulty.   No chronic medical issues.   There are no active problems to display for this patient.  Past Medical History:  Diagnosis Date  . Hyperhydrosis disorder   . Hypertension   . Migraine    Past Surgical History:  Procedure Laterality Date  . JOINT REPLACEMENT    . KNEE SURGERY     No Known Allergies Prior to Admission medications   Medication Sig Start Date End Date Taking? Authorizing Provider  OXYBUTYNIN CHLORIDE PO Take by mouth.    [provider]   Social History   Socioeconomic History  . Marital status: Married    Spouse name: Not on file  . Number of children: Not on file  . Years of education: Not on file  . Highest education level: Not on file  Occupational History  . Not on file  Social Needs  . Financial resource strain: Not on file  . Food insecurity:    Worry: Not on file    Inability: Not on file  . Transportation needs:    Medical: Not on file    Non-medical: Not on file  Tobacco Use  . Smoking status: Former Games developer  . Smokeless tobacco: Never Used  Substance and Sexual Activity  . Alcohol use: No  . Drug use: No  . Sexual activity: Not on file  Lifestyle  . Physical activity:    Days per week: Not on file    Minutes per session: Not on file  . Stress: Not on file  Relationships  . Social connections:    Talks on phone: Not on file    Gets together: Not on file    Attends  religious service: Not on file    Active member of club or organization: Not on file    Attends meetings of clubs or organizations: Not on file    Relationship status: Not on file  . Intimate partner violence:    Fear of current or ex partner: Not on file    Emotionally abused: Not on file    Physically abused: Not on file    Forced sexual activity: Not on file  Other Topics Concern  . Not on file  Social History Narrative  . 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     Objective:   Physical Exam Vitals signs reviewed.  Constitutional:      Appearance: He is well-developed.  HENT:     Head: Normocephalic and atraumatic.     Right Ear: External ear normal.     Left Ear: External ear normal.  Eyes:     Conjunctiva/sclera: Conjunctivae normal.     Pupils: Pupils are equal, round, and reactive to light.  Neck:     Musculoskeletal: Normal range of motion and neck supple.     Thyroid: No thyromegaly.  Cardiovascular:  Rate and Rhythm: Normal rate and regular rhythm.     Heart sounds: Normal heart sounds.  Pulmonary:     Effort: Pulmonary effort is normal. No respiratory distress.     Breath sounds: Normal breath sounds. No wheezing.  Abdominal:     General: There is no distension.     Palpations: Abdomen is soft.     Tenderness: There is no abdominal tenderness.     Hernia: There is no hernia in the right inguinal area or left inguinal area.  Musculoskeletal: Normal range of motion.        General: No tenderness.  Lymphadenopathy:     Cervical: No cervical adenopathy.  Skin:    General: Skin is warm and dry.  Neurological:     Mental Status: He is alert and oriented to person, place, and time.     Deep Tendon Reflexes: Reflexes are normal and symmetric.  Psychiatric:        Behavior: Behavior normal.    Vitals:   05/02/18 1117  BP: 118/82  Pulse: 82  Resp: 16  SpO2: 97%  Weight: 289 lb 9.6 oz (131.4 kg)  Height: 6\' 3"  (1.905 m)     Hearing Screening Comments: Whisper test: pass at 15 ft Vision Screening Comments: Peripheral vision: 85 both left and right Color : passed     Assessment & Plan:  Adam Alvarez is a 33 y.o. male Encounter for commercial driver medical examination (CDME)  - no concerns on exam.history - 2 year card - see paperwork.   No orders of the defined types were placed in this encounter.  Patient Instructions       If you have lab work done today you will be contacted with your lab results within the next 2 weeks.  If you have not heard from [place] then please contact us. The fastest way to get your results is to register for My Chart.   IF you received an x-ray today, you will receive an invoice from [facility]. Please contact [facility] at [phone] with questions or concerns regarding your invoice.   IF you received labwork today, you will receive an invoice from [facility]. Please contact [facility] at [phone] with questions or concerns regarding your invoice.   Our billing staff will not be able to assist you with questions regarding bills from these companies.  You will be contacted with the lab results as soon as they are available. The fastest way to get your results is to activate your My Chart account. Instructions are located on the last page of this paperwork. If you have not heard from [place] regarding the results in 2 weeks, please contact this office.       Signed,   Meredith Staggers, MD Primary Care at [facility].  05/02/18 12:05 PM

## 2018-05-02 NOTE — Patient Instructions (Signed)
° ° ° °  If you have lab work done today you will be contacted with your lab results within the next 2 weeks.  If you have not heard from us then please contact us. The fastest way to get your results is to register for My Chart. ° ° °IF you received an x-ray today, you will receive an invoice from [facility]. Please contact [facility] at [phone] with questions or concerns regarding your invoice.  ° °IF you received labwork today, you will receive an invoice from [facility]. Please contact [facility] at [phone] with questions or concerns regarding your invoice.  ° °Our billing staff will not be able to assist you with questions regarding bills from these companies. ° °You will be contacted with the lab results as soon as they are available. The fastest way to get your results is to activate your My Chart account. Instructions are located on the last page of this paperwork. If you have not heard from us regarding the results in 2 weeks, please contact this office. °  ° ° ° °

## 2018-07-04 ENCOUNTER — Telehealth (INDEPENDENT_AMBULATORY_CARE_PROVIDER_SITE_OTHER): Payer: Managed Care, Other (non HMO) | Admitting: Family Medicine

## 2018-07-04 VITALS — Wt 293.0 lb

## 2018-07-04 DIAGNOSIS — R635 Abnormal weight gain: Secondary | ICD-10-CM | POA: Diagnosis not present

## 2018-07-04 DIAGNOSIS — L749 Eccrine sweat disorder, unspecified: Secondary | ICD-10-CM

## 2018-07-04 MED ORDER — OXYBUTYNIN CHLORIDE 5 MG PO TABS: 5.0000 mg | ORAL_TABLET | Freq: Three times a day (TID) | ORAL | 2 refills | Status: DC

## 2018-07-04 NOTE — Patient Instructions (Addendum)
No change in oxybutynin dose for now.   I would recommend meeting with weight management specialist.    Quillian Quince, MD Medical Weight Loss Management  . [phone]    If you have lab work done today you will be contacted with your lab results within the next 2 weeks.  If you have not heard from [place] then please contact us. The fastest way to get your results is to register for My Chart.   IF you received an x-ray today, you will receive an invoice from [facility]. Please contact [facility] at [phone] with questions or concerns regarding your invoice.   IF you received labwork today, you will receive an invoice from [facility]. Please contact [facility] at [phone] with questions or concerns regarding your invoice.   Our billing staff will not be able to assist you with questions regarding bills from these companies.  You will be contacted with the lab results as soon as they are available. The fastest way to get your results is to activate your My Chart account. Instructions are located on the last page of this paperwork. If you have not heard from [place] regarding the results in 2 weeks, please contact this office.

## 2018-07-04 NOTE — Progress Notes (Signed)
CC: Discuss weight gain.  No other concerns per pt.  Pt would like refill on oxybutynin.  No recent bp but weight noted in vitals.  No travel outside the [place] or  in the past 3 weeks.

## 2018-07-04 NOTE — Progress Notes (Signed)
Virtual Visit via Telephone Note  I connected with Adam Alvarez on 07/04/18 at 5:46 PM by telephone and verified that I am speaking with the correct person using two identifiers.   I discussed the limitations, risks, security and privacy concerns of performing an evaluation and management service by telephone and the availability of in person appointments. I also discussed with the patient that there may be a patient responsible charge related to this service. The patient expressed understanding and agreed to proceed, consent obtained  Chief complaint: Weight gain, oxybutynin refill.   History of Present Illness: Adam Alvarez is a 33 y.o. male  Hyperhidrosis: See prior notes, has tried other approaches, but ultimately oxybutynin has been most beneficial. Sweating abnormality/history of excessive sweating.  Has used oxybutynin 5mg  tid. Has been doing well overall, but some days makes him sleepy (depends if slept enough the night before). No known snoring. No hx of OSA. Otherwise meds working new side effects.  Occasional constipation only - metamucil at times, and otc herbal morenga supplement. That as been doing ok to manage constipation, and with diet adjustments.   Weight concern: Past year has been gaining.  Tried fasting, eating better. Still feels like gaining weight.  70 pounds since last year.  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 Treadmill 3 times per week - about an hour and a half. preworkout drink before.  Lab Results  Component Value Date   TSH 1.450 04/07/2018    Wt Readings from Last 3 Encounters:  07/04/18 293 lb (132.9 kg)  05/02/18 289 lb 9.6 oz (131.4 kg)  04/07/18 285 lb 6.4 oz (129.5 kg)  home weight 293 few days ago at home.  Weight 256 in 12/2016 Weight 272  06/2017 Feels depressed about the weight gain, and being a trainer. No true depression, just feels down about the weight.  Is agreeable to meeting with weight  management to discuss approach.   Depression screen [facility] 2/9 07/04/2018 05/02/2018 04/07/2018 09/02/2017 07/02/2017  Decreased Interest 0 0 0 0 0  Down, Depressed, Hopeless 0 0 0 0 0  PHQ - 2 Score 0 0 0 0 0    There are no active problems to display for this patient.  Past Medical History:  Diagnosis Date  . Hyperhydrosis disorder   . Hypertension   . Migraine    Past Surgical History:  Procedure Laterality Date  . JOINT REPLACEMENT    . KNEE SURGERY     No Known Allergies Prior to Admission medications   Medication Sig Start Date End Date Taking? Authorizing Provider  OXYBUTYNIN CHLORIDE PO Take by mouth.   Yes [provider]   Social History   Socioeconomic History  . Marital status: Married    Spouse name: Not on file  . Number of children: Not on file  . Years of education: Not on file  . Highest education level: Not on file  Occupational History  . Not on file  Social Needs  . Financial resource strain: Not on file  . Food insecurity:    Worry: Not on file    Inability: Not on file  . Transportation needs:    Medical: Not on file    Non-medical: Not on file  Tobacco Use  . Smoking status: Former Games developer  . Smokeless tobacco: Never Used  Substance and Sexual Activity  . Alcohol use: No  . Drug use: No  . Sexual activity: Not on file  Lifestyle  .  Physical activity:    Days per week: Not on file    Minutes per session: Not on file  . Stress: Not on file  Relationships  . Social connections:    Talks on phone: Not on file    Gets together: Not on file    Attends religious service: Not on file    Active member of club or organization: Not on file    Attends meetings of clubs or organizations: Not on file    Relationship status: Not on file  . Intimate partner violence:    Fear of current or ex partner: Not on file    Emotionally abused: Not on file    Physically abused: Not on file    Forced sexual activity: Not on file  Other Topics Concern   . Not on file  Social History Narrative  . Not on file     Observations/Objective: No distress on phone, appropriate responses.  Denied depression.  PHQ reviewed  Assessment and Plan: Weight gain  -Up 20 pounds over the past 1 year by our records, reports greater weight gain based on his readings at home.  -Phone number provided to medical bariatric specialist to evaluate weight gain and recommendations further.  TSH in February was reassuring.  Sweating abnormality - Plan: oxybutynin (DITROPAN) 5 MG tablet  -Overall controlled with oxybutynin, continue same dose.  If more frequent sedation, especially if not associated with decreased sleep the night before, would recommend further discussion determine if sleep evaluation needed.  Infrequent symptoms at present, denies history of OSA or snoring  Follow Up Instructions:  As needed after weight mgt. specialist eval.   Patient Instructions   No change in oxybutynin dose for now.   I would recommend meeting with weight management specialist.    Quillian Quincearen Beasley, MD Medical Weight Loss Management  . [phone]    If you have lab work done today you will be contacted with your lab results within the next 2 weeks.  If you have not heard from us then please contact us. The fastest way to get your results is to register for My Chart.   IF you received an x-ray today, you will receive an invoice from [facility]. Please contact [facility] at [phone] with questions or concerns regarding your invoice.   IF you received labwork today, you will receive an invoice from [facility]. Please contact [facility] at [phone] with questions or concerns regarding your invoice.   Our billing staff will not be able to assist you with questions regarding bills from these companies.  You will be contacted with the lab results as soon as they are available. The fastest way to get your results is to activate your My Chart account.  Instructions are located on the last page of this paperwork. If you have not heard from us regarding the results in 2 weeks, please contact this office.       I discussed the assessment and treatment plan with the patient. The patient was provided an opportunity to ask questions and all were answered. The patient agreed with the plan and demonstrated an understanding of the instructions.   The patient was advised to call back or seek an in-person evaluation if the symptoms worsen or if the condition fails to improve as anticipated.  I provided 17 minutes of non-face-to-face time during this encounter.  Signed,   Meredith StaggersJeffrey Chima Astorino, MD Primary Care at [facility].  07/04/18

## 2018-12-15 ENCOUNTER — Encounter (INDEPENDENT_AMBULATORY_CARE_PROVIDER_SITE_OTHER): Payer: Self-pay | Admitting: Bariatrics

## 2018-12-19 ENCOUNTER — Other Ambulatory Visit: Payer: Self-pay

## 2018-12-19 ENCOUNTER — Encounter: Payer: Self-pay | Admitting: Bariatrics

## 2018-12-19 ENCOUNTER — Ambulatory Visit (INDEPENDENT_AMBULATORY_CARE_PROVIDER_SITE_OTHER): Payer: Managed Care, Other (non HMO) | Admitting: Bariatrics

## 2018-12-19 ENCOUNTER — Encounter (INDEPENDENT_AMBULATORY_CARE_PROVIDER_SITE_OTHER): Payer: Self-pay | Admitting: Bariatrics

## 2018-12-19 VITALS — BP 138/98 | HR 85 | Temp 98.7°F | Ht 74.0 in | Wt 283.0 lb

## 2018-12-19 DIAGNOSIS — R030 Elevated blood-pressure reading, without diagnosis of hypertension: Secondary | ICD-10-CM | POA: Diagnosis not present

## 2018-12-19 DIAGNOSIS — Z9189 Other specified personal risk factors, not elsewhere classified: Secondary | ICD-10-CM | POA: Diagnosis not present

## 2018-12-19 DIAGNOSIS — E559 Vitamin D deficiency, unspecified: Secondary | ICD-10-CM

## 2018-12-19 DIAGNOSIS — H4089 Other specified glaucoma: Secondary | ICD-10-CM | POA: Diagnosis not present

## 2018-12-19 DIAGNOSIS — Z6836 Body mass index (BMI) 36.0-36.9, adult: Secondary | ICD-10-CM

## 2018-12-19 DIAGNOSIS — Z0289 Encounter for other administrative examinations: Secondary | ICD-10-CM

## 2018-12-19 DIAGNOSIS — Z1331 Encounter for screening for depression: Secondary | ICD-10-CM

## 2018-12-19 DIAGNOSIS — R0602 Shortness of breath: Secondary | ICD-10-CM

## 2018-12-19 DIAGNOSIS — R5383 Other fatigue: Secondary | ICD-10-CM

## 2018-12-19 DIAGNOSIS — E66812 Obesity, class 2: Secondary | ICD-10-CM

## 2018-12-19 NOTE — Progress Notes (Signed)
Office: [phone]  /  Fax: [phone]   Dear Dr. Neva Alvarez,   Thank you for referring Adam Alvarez to our clinic. The following note includes my evaluation and treatment recommendations.  HPI:   Chief Complaint: OBESITY    Adam Alvarez has been referred by Adam Alvarez. Adam Seat, MD for consultation regarding his obesity and obesity related comorbidities.    Adam Alvarez (MR# [id number]) is a 33 y.o. male who presents on 12/19/2018 for obesity evaluation and treatment. Current BMI is Body mass index is 36.34 kg/m.Marland Kitchen Adam Alvarez has been struggling with his weight for many years and has been unsuccessful in either losing weight, maintaining weight loss, or reaching his healthy weight goal.     Adam Alvarez is lactose intolerant. He eats out 3 time a week. He states he does like to cook. He states that he is not a picky eater, and he states he is trying to be a vegetarian or vegan.     Adam Alvarez attended our information session and states he is currently in the action stage of change and ready to dedicate time achieving and maintaining a healthier weight. Adam Alvarez is interested in becoming our patient and working on intensive lifestyle modifications including (but not limited to) diet, exercise and weight loss.    Adam Alvarez states his family eats meals together he thinks his family will eat healthier with  him his desired weight loss is 53 lbs he has been heavy most of  his life he started gaining weight in 2015 his heaviest weight ever was 290 lbs he has significant food cravings issues  he snacks frequently in the evenings he skips meals frequently he is trying to eat vegan he is frequently drinking liquids with calories he frequently makes poor food choices he frequently eats larger portions than normal  he struggles with emotional eating    Adam Alvarez feels his energy is lower than it should be. This has worsened with weight gain and has not worsened recently. Adam Alvarez admits to  daytime somnolence and  admits to waking up still tired. Patient is at risk for obstructive sleep apnea. Adam Alvarez has a history of symptoms of daytime Adam. Patient generally gets 4 hours of sleep per night, and states they generally have nightime awakenings. Snoring is present. Apneic episodes are not present. Epworth Sleepiness Score is 3.  Dyspnea on exertion Adam Alvarez notes increasing shortness of breath with exercising and seems to be worsening over time with weight gain. He notes getting out of breath sooner with activity than he used to. This has not gotten worse recently. Adam Alvarez denies orthopnea.  Elevated Blood Pressure Reading Adam Alvarez states his blood pressure was elevated once before, but when checking at home is was not elevated at home.  At risk for cardiovascular disease Adam Alvarez is at a higher than average risk for cardiovascular disease due to obesity and elevated blood pressure. He currently denies any chest pain.  Glaucoma (Right Eye) Adam Alvarez has glaucoma in his right eye and notes increased pressure.  Vitamin D Deficiency Adam Alvarez has a diagnosis of vitamin D deficiency. He is not currently taking Vit D and denies nausea, vomiting or muscle weakness.  Depression Screen Adam Alvarez's Food and Mood (modified PHQ-9) score was  Depression screen PHQ 2/9 12/19/2018  Decreased Interest 2  Down, Depressed, Hopeless 0  PHQ - 2 Score 2  Altered sleeping 1  Tired, decreased energy 3  Change in appetite 1  Feeling bad or failure about yourself  0  Trouble concentrating 1  Moving slowly or fidgety/restless 0  Suicidal thoughts 0  PHQ-9 Score 8  Difficult doing work/chores Not difficult at all    ASSESSMENT AND PLAN:  Other Adam - Plan: EKG 12-Lead, Comprehensive metabolic panel, Hemoglobin A1c, Insulin, random, VITAMIN D 25 Hydroxy (Vit-D Deficiency, Fractures)  SOB (shortness of breath) on exertion - Plan: Lipid Panel With LDL/HDL Ratio  Elevated blood pressure reading -  Plan: Comprehensive metabolic panel  Other glaucoma of right eye  Vitamin D deficiency  Depression screening  At risk for heart disease  Class 2 severe obesity with serious comorbidity and body mass index (BMI) of 36.0 to 36.9 in adult, unspecified obesity type (HCC)  PLAN:  Adam Alvarez was informed that his Adam may be related to obesity, depression or many other causes. Labs will be ordered, and in the meanwhile Adam Alvarez has agreed to work on diet, exercise and weight loss to help with Adam. Proper sleep hygiene was discussed including the need for 7-8 hours of quality sleep each night. A sleep study was not ordered based on symptoms and Epworth score.  Dyspnea on exertion Adam Alvarez shortness of breath appears to be obesity related and exercise induced. He has agreed to work on weight loss and gradually increase exercise to treat his exercise induced shortness of breath. If [name] follows our instructions and loses weight without improvement of his shortness of breath, we will plan to refer to pulmonology. We will monitor this condition regularly. [name] agrees to this plan.  Elevated Blood Pressure Reading Adam Alvarez is to decreased his sodium and we will watch his blood pressure over time. Daiton agrees to follow up with our clinic in 2 to 3 weeks.  Cardiovascular risk counseling Kamran was given extended (15 minutes) coronary artery disease prevention counseling today. He is 33 y.o. male and has risk factors for heart disease including obesity and elevated blood pressure. We discussed intensive lifestyle modifications today with an emphasis on specific weight loss instructions and strategies. Pt was also informed of the importance of increasing exercise and decreasing saturated fats to help prevent heart disease.  Glaucoma (Right Eye) We discussed that he may not be able to take certain medications. He is to remind all of his providers that he has glaucoma. Cranford will  follow up with his provider (twice a year).   Vitamin D Deficiency Adam Alvarez was informed that low vitamin D levels contributes to Adam and are associated with obesity, breast, and colon cancer. He will follow up for routine testing of vitamin D, at least 2-3 times per year. He was informed of the risk of over-replacement of vitamin D and agrees to not increase his dose unless he discusses this with [place] first. We will check Vit D level today. Adam Alvarez agrees to follow up with our clinic in 2 to 3 weeks.  Depression Screen Adam Alvarez had a mildly positive depression screening. Depression is commonly associated with obesity and often results in emotional eating behaviors. We will monitor this closely and work on CBT to help improve the non-hunger eating patterns. Referral to Psychology may be required if no improvement is seen as he continues in our clinic.  Obesity Adam Alvarez is currently in the action stage of change and his goal is to continue with weight loss efforts. I recommend Adam Alvarez begin the structured treatment plan as follows:  He has agreed to follow the Category 3 plan with added breakfast options Eulises has been instructed to eventually work up to a goal of 150 minutes of combined cardio  and strengthening exercise per week for weight loss and overall health benefits. We discussed the following Behavioral Modification Strategies today: increasing lean protein intake, decreasing simple carbohydrates, increasing vegetables, decrease eating out, work on meal planning and easy cooking plans, ways to avoid night time snacking and decrease liquid calories, increase H20 intake, no skipping meals, and keeping healthy foods in the home   He was informed of the importance of frequent follow up visits to maximize his success with intensive lifestyle modifications for his multiple health conditions. He was informed we would discuss his lab results at his next visit unless there is a critical issue that  needs to be addressed sooner. [name] agreed to keep his next visit at the agreed upon time to discuss these results.  ALLERGIES: No Known Allergies  MEDICATIONS: Current Outpatient Medications on File Prior to Visit  Medication Sig Dispense Refill  . oxybutynin (DITROPAN) 5 MG tablet Take 1 tablet (5 mg total) by mouth 3 (three) times daily. 90 tablet 2   No current facility-administered medications on file prior to visit.     PAST MEDICAL HISTORY: Past Medical History:  Diagnosis Date  . Back pain   . Glaucoma   . Hyperhydrosis disorder   . Hypertension   . Lactose intolerance   . Migraine     PAST SURGICAL HISTORY: Past Surgical History:  Procedure Laterality Date  . JOINT REPLACEMENT    . KNEE SURGERY      SOCIAL HISTORY: Social History   Tobacco Use  . Smoking status: Former Games developermoker  . Smokeless tobacco: Never Used  Substance Use Topics  . Alcohol use: No  . Drug use: No    FAMILY HISTORY: Family History  Problem Relation Age of Onset  . High blood pressure Mother   . Obesity Mother     ROS: Review of Systems  Constitutional: Positive for malaise/Adam. Negative for weight loss.       + Trouble sleeping  Eyes: Positive for blurred vision and double vision.       + Wear glasses or contacts  Respiratory: Positive for shortness of breath (with exertion).   Cardiovascular: Negative for orthopnea.  Gastrointestinal: Negative for nausea and vomiting.  Musculoskeletal:       Negative muscle weakness    PHYSICAL EXAM: Blood pressure (!) 138/98, pulse 85, temperature 98.7 F (37.1 C), height 6\' 2"  (1.88 m), weight 283 lb (128.4 kg), SpO2 98 %. Body mass index is 36.34 kg/m. Physical Exam Vitals signs reviewed.  Constitutional:      Appearance: Normal appearance. He is obese.  HENT:     Head: Normocephalic and atraumatic.     Nose: Nose normal.  Eyes:     General: No scleral icterus.    Extraocular Movements: Extraocular movements intact.   Neck:     Musculoskeletal: Normal range of motion and neck supple.     Comments: No thyromegaly present Cardiovascular:     Rate and Rhythm: Normal rate and regular rhythm.     Pulses: Normal pulses.     Heart sounds: Normal heart sounds.  Pulmonary:     Effort: Pulmonary effort is normal. No respiratory distress.     Breath sounds: Normal breath sounds.  Abdominal:     Palpations: Abdomen is soft.     Tenderness: There is no abdominal tenderness.     Comments: + Obesity  Musculoskeletal: Normal range of motion.     Right lower leg: No edema.     Left lower  leg: No edema.  Skin:    General: Skin is warm and dry.  Neurological:     Mental Status: He is alert and oriented to person, place, and time.     Coordination: Coordination normal.  Psychiatric:        Mood and Affect: Mood normal.        Behavior: Behavior normal.     RECENT LABS AND TESTS: BMET    Component Value Date/Time   NA 140 07/02/2017 1030   K 4.4 07/02/2017 1030   CL 102 07/02/2017 1030   CO2 20 07/02/2017 1030   GLUCOSE 89 07/02/2017 1030   GLUCOSE 90 04/11/2017 0125   BUN 14 07/02/2017 1030   CREATININE 1.03 07/02/2017 1030   CALCIUM 9.4 07/02/2017 1030   GFRNONAA 97 07/02/2017 1030   GFRAA 112 07/02/2017 1030   No results found for: HGBA1C No results found for: INSULIN CBC    Component Value Date/Time   WBC 3.7 (A) 04/07/2018 1640   WBC 4.1 04/11/2017 0125   RBC 4.98 04/07/2018 1640   RBC 4.67 04/11/2017 0125   HGB 14.7 (A) 04/07/2018 1640   HGB 14.3 04/11/2017 0125   HGB 14.0 02/03/2017 1455   HCT 43.6 (A) 04/07/2018 1640   HCT 40.4 04/11/2017 0125   HCT 41.9 02/03/2017 1455   PLT 217 04/11/2017 0125   PLT 232 02/03/2017 1455   MCV 87.5 04/07/2018 1640   MCV 89 02/03/2017 1455   MCH 29.6 04/07/2018 1640   MCH 30.6 04/11/2017 0125   MCHC 33.8 04/07/2018 1640   MCHC 35.4 04/11/2017 0125   RDW 12.5 04/11/2017 0125   RDW 14.7 02/03/2017 1455   LYMPHSABS 1.5 04/11/2017 0125    MONOABS 0.4 04/11/2017 0125   EOSABS 0.1 04/11/2017 0125   BASOSABS 0.0 04/11/2017 0125   Iron/TIBC/Ferritin/ %Sat No results found for: IRON, TIBC, FERRITIN, IRONPCTSAT Lipid Panel  No results found for: CHOL, TRIG, HDL, CHOLHDL, VLDL, LDLCALC, LDLDIRECT Hepatic Function Panel     Component Value Date/Time   PROT 6.9 07/02/2017 1030   ALBUMIN 4.5 07/02/2017 1030   AST 23 07/02/2017 1030   ALT 38 07/02/2017 1030   ALKPHOS 71 07/02/2017 1030   BILITOT 0.7 07/02/2017 1030      Component Value Date/Time   TSH 1.450 04/07/2018 1644   TSH 2.070 07/02/2017 1030   TSH 1.640 02/03/2017 1455    ECG  shows NSR with a rate of 82 BPM INDIRECT CALORIMETER done today shows a VO2 of 312 and a REE of 2169.  His calculated basal metabolic rate is 4098 thus his basal metabolic rate is worse than expected.       OBESITY BEHAVIORAL INTERVENTION VISIT  Today's visit was # 1   Starting weight: 283 lbs Starting date: 12/19/2018 Today's weight : 283 lbs  Today's date: 12/19/2018 Total lbs lost to date: 0    ASK: We discussed the diagnosis of obesity with Gildardo Pounds Collet today and Cully agreed to give [place] permission to discuss obesity behavioral modification therapy today.  ASSESS: Theo has the diagnosis of obesity and his BMI today is 36.32 Quintin is in the action stage of change   ADVISE: Roberto was educated on the multiple health risks of obesity as well as the benefit of weight loss to improve his health. He was advised of the need for long term treatment and the importance of lifestyle modifications to improve his current health and to decrease his risk of future health problems.  AGREE: Multiple dietary modification options and treatment options were discussed and  Lenox agreed to follow the recommendations documented in the above note.  ARRANGE: Rony was educated on the importance of frequent visits to treat obesity as outlined per CMS and USPSTF guidelines and  agreed to schedule his next follow up appointment today.  Trude Mcburney, am acting as transcriptionist for [employer], DO   I have reviewed the above documentation for accuracy and completeness, and I agree with the above. -Corinna Capra, DO

## 2018-12-20 ENCOUNTER — Telehealth: Payer: Self-pay | Admitting: Family Medicine

## 2018-12-20 ENCOUNTER — Encounter (INDEPENDENT_AMBULATORY_CARE_PROVIDER_SITE_OTHER): Payer: Self-pay | Admitting: Bariatrics

## 2018-12-20 DIAGNOSIS — E785 Hyperlipidemia, unspecified: Secondary | ICD-10-CM | POA: Insufficient documentation

## 2018-12-20 DIAGNOSIS — E559 Vitamin D deficiency, unspecified: Secondary | ICD-10-CM | POA: Insufficient documentation

## 2018-12-20 LAB — COMPREHENSIVE METABOLIC PANEL
ALT: 38 IU/L (ref 0–44)
AST: 21 IU/L (ref 0–40)
Albumin/Globulin Ratio: 1.8 (ref 1.2–2.2)
Albumin: 4.5 g/dL (ref 4.0–5.0)
Alkaline Phosphatase: 61 IU/L (ref 39–117)
BUN/Creatinine Ratio: 10 (ref 9–20)
BUN: 14 mg/dL (ref 6–20)
Bilirubin Total: 0.6 mg/dL (ref 0.0–1.2)
CO2: 27 mmol/L (ref 20–29)
Calcium: 9.6 mg/dL (ref 8.7–10.2)
Chloride: 101 mmol/L (ref 96–106)
Creatinine, Ser: 1.43 mg/dL — ABNORMAL HIGH (ref 0.76–1.27)
GFR calc Af Amer: 74 mL/min/{1.73_m2} (ref >59)
GFR calc non Af Amer: 64 mL/min/{1.73_m2} (ref >59)
Globulin, Total: 2.5 g/dL (ref 1.5–4.5)
Glucose: 93 mg/dL (ref 65–99)
Potassium: 4.8 mmol/L (ref 3.5–5.2)
Sodium: 138 mmol/L (ref 134–144)
Total Protein: 7 g/dL (ref 6.0–8.5)

## 2018-12-20 LAB — LIPID PANEL WITH LDL/HDL RATIO
Cholesterol, Total: 218 mg/dL — ABNORMAL HIGH (ref 100–199)
HDL: 37 mg/dL — ABNORMAL LOW (ref >39)
LDL Chol Calc (NIH): 134 mg/dL — ABNORMAL HIGH (ref 0–99)
LDL/HDL Ratio: 3.6 ratio (ref 0.0–3.6)
Triglycerides: 259 mg/dL — ABNORMAL HIGH (ref 0–149)
VLDL Cholesterol Cal: 47 mg/dL — ABNORMAL HIGH (ref 5–40)

## 2018-12-20 LAB — HEMOGLOBIN A1C
Est. average glucose Bld gHb Est-mCnc: 108 mg/dL
Hgb A1c MFr Bld: 5.4 % (ref 4.8–5.6)

## 2018-12-20 LAB — VITAMIN D 25 HYDROXY (VIT D DEFICIENCY, FRACTURES): Vit D, 25-Hydroxy: 10 ng/mL — ABNORMAL LOW (ref 30.0–100.0)

## 2018-12-20 LAB — INSULIN, RANDOM: INSULIN: 13.6 u[IU]/mL (ref 2.6–24.9)

## 2018-12-20 NOTE — Telephone Encounter (Signed)
Pt is wanting a referral placed for a gastroenterologist. I saw one was placed last year but he said he never went to the appt. He is wanting the referral for ongoing stomach issues.

## 2018-12-21 ENCOUNTER — Encounter (INDEPENDENT_AMBULATORY_CARE_PROVIDER_SITE_OTHER): Payer: Self-pay | Admitting: Bariatrics

## 2018-12-21 NOTE — Telephone Encounter (Signed)
Pt was last seen 06/2018 for a tele-med appt.   Please advise on gastro referral if appropriate or would you like the pt to come to the office before the referral gets sent?

## 2018-12-21 NOTE — Telephone Encounter (Signed)
Would recommend discussing the specific stomach issues as treated for constipation in the past.  It would be best if we at least have an appointment so I can review specific symptoms and reason for referral as previous one was last year.

## 2018-12-22 ENCOUNTER — Encounter: Payer: Self-pay | Admitting: *Deleted

## 2018-12-26 ENCOUNTER — Encounter: Payer: Self-pay | Admitting: Family Medicine

## 2018-12-26 ENCOUNTER — Other Ambulatory Visit: Payer: Self-pay

## 2018-12-26 ENCOUNTER — Ambulatory Visit (INDEPENDENT_AMBULATORY_CARE_PROVIDER_SITE_OTHER): Payer: Managed Care, Other (non HMO) | Admitting: Family Medicine

## 2018-12-26 VITALS — BP 136/77 | HR 78 | Temp 98.5°F | Wt 289.4 lb

## 2018-12-26 DIAGNOSIS — R519 Headache, unspecified: Secondary | ICD-10-CM | POA: Diagnosis not present

## 2018-12-26 DIAGNOSIS — E559 Vitamin D deficiency, unspecified: Secondary | ICD-10-CM

## 2018-12-26 DIAGNOSIS — H4089 Other specified glaucoma: Secondary | ICD-10-CM

## 2018-12-26 MED ORDER — VITAMIN D (ERGOCALCIFEROL) 1.25 MG (50000 UNIT) PO CAPS: 50000.0000 [IU] | ORAL_CAPSULE | ORAL | 0 refills | Status: DC

## 2018-12-26 NOTE — Patient Instructions (Addendum)
I am glad to hear that the symptoms from Friday have improved, but agree with the evaluation by ophthalmology today.  It is possible that glaucoma can contribute to headaches, so make sure to discuss treatment options and take medication as prescribed by your eye specialist.  Do not drive if you are having headaches or any blurry vision.  I will also refer you to a headache specialist to evaluate for possible migraines contributing to the headache.  If any acute worsening headache, or return of blurry vision, be evaluated here or emergency room if needed.  Vitamin D was very low.  I sent a prescription for vitamin D supplement once per week to your pharmacy.  That should be rechecked in the next 6 weeks, but can be discussed with [facility].  Thanks for coming in today.  Return to the clinic or go to the nearest emergency room if any of your symptoms worsen or new symptoms occur.   Recurrent Migraine Headache A migraine headache is very bad, throbbing pain that is usually on one side of your head. Recurrent migraines keep coming back (recurring). Talk with your doctor about what things may bring on (trigger) your migraine headaches. Follow these instructions at home: Medicines  Take over-the-counter and prescription medicines only as told by your doctor.  Do not drive or use heavy machinery while taking prescription pain medicine. Lifestyle  Do not use any products that contain nicotine or tobacco, such as cigarettes and e-cigarettes. If you need help quitting, ask your doctor.  Limit alcohol intake to no more than 1 drink a day for nonpregnant women and 2 drinks a day for men. One drink equals 12 oz of beer, 5 oz of wine, or 1 oz of hard liquor.  Get 7-9 hours of sleep each night.  Lessen any stress in your life. Ask your doctor about ways to lower your stress.  Stay at a healthy weight. Talk with your doctor if you need help losing weight.  Get regular  exercise. General instructions   Keep a journal to find out if certain things bring on migraine headaches. For example, write down: ? What you eat and drink. ? How much sleep you get. ? Any change to your diet or medicines.  Lie down in a dark, quiet room when you have a migraine.  Try placing a cool towel over your head when you have a migraine.  Keep lights dim if bright lights bother you or make your migraines worse.  Keep all follow-up visits as told by your doctor. This is important. Contact a doctor if:  Medicine does not help your migraines.  Your pain keeps coming back.  You have a fever.  You have weight loss without trying. Get help right away if:  Your migraine becomes really bad and medicine does not help.  You have a stiff neck.  You have trouble seeing.  Your muscles are weak or you lose control of your muscles.  You lose your balance or have trouble walking.  You feel like you will pass out (faint) or you pass out.  You have really bad symptoms that are different than your first symptoms.  You start having sudden, very bad headaches that last for one second or less, like a thunderclap. Summary  A migraine headache is very bad, throbbing pain that is usually on one side of your head.  Talk with your doctor about what things may bring on (trigger) your migraine headaches.  Take over-the-counter  and prescription medicines only as told by your doctor.  Lie down in a dark, quiet room when you have a migraine.  Keep a journal about what you eat and drink, how much sleep you get, and any changes to your medicines. This can help you find out if certain things make you have migraine headaches. This information is not intended to replace advice given to you by your health care provider. Make sure you discuss any questions you have with your health care provider. Document Released: 11/12/2007 Document Revised: 02/05/2017 Document Reviewed:  12/27/2015 Elsevier Patient Education  [employer].    If you have lab work done today you will be contacted with your lab results within the next 2 weeks.  If you have not heard from [place] then please contact us. The fastest way to get your results is to register for My Chart.   IF you received an x-ray today, you will receive an invoice from [facility]. Please contact [facility] at [phone] with questions or concerns regarding your invoice.   IF you received labwork today, you will receive an invoice from [facility]. Please contact [facility] at [phone] with questions or concerns regarding your invoice.   Our billing staff will not be able to assist you with questions regarding bills from these companies.  You will be contacted with the lab results as soon as they are available. The fastest way to get your results is to activate your My Chart account. Instructions are located on the last page of this paperwork. If you have not heard from [place] regarding the results in 2 weeks, please contact this office.

## 2018-12-26 NOTE — Progress Notes (Signed)
Subjective:  Patient ID: Adam Alvarez, male    DOB: 06-18-1985  Age: 33 y.o. MRN: [id number]  CC:  Chief Complaint  Patient presents with  . Headache    having issues with headaches last head was friday. Was on med before for the headache but had to stop due to the side effect.     HPI Adam Alvarez presents for   Headaches: History of migraine headache, discussed in 2018 Prior treatment with imitrex as needed, but did not tolerate side effects - out of body experience, heart racing.  HA back and forth since that time. 7-8 per year, last all day. Nausea/no vomiting. Equilibrium off. No aura.  R sided HA.   Eye doctor diagnosed glaucoma in R eye in 2015, yearly visits. Rx drops - latanoprost?  Went back to optho 1 month ago - getting worse - not using drops as recommended.  Only used gtts for 2 months, then none since. Still not using drops. Not having side effects, just doesn't like to use drops. Considering restarting drops.  appt with optho today.   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 Had to stop driving.  HA improved overnight, blurry vision improved. Feels better now - no treatment  Recent HA different as blurry vision with HA, but now resolved.   Has 32 year old, works night shift. interrupted sleep 4 hrs per day. Drinking coffee, thermogenix to stay awake.   Low vitamin D. Reading of 10 one week ago at [facility] weight management.    History Patient Active Problem List   Diagnosis Date Noted  . Dyslipidemia 12/20/2018  . Vitamin D deficiency 12/20/2018  . Hyperhidrosis 12/17/2017   Past Medical History:  Diagnosis Date  . Back pain   . Glaucoma   . Hyperhydrosis disorder   . Hypertension   . Lactose intolerance   . Migraine    Past Surgical History:  Procedure Laterality Date  . JOINT REPLACEMENT    . KNEE SURGERY     No Known Allergies Prior to Admission medications   Medication Sig  Start Date End Date Taking? Authorizing Provider  oxybutynin (DITROPAN) 5 MG tablet Take 1 tablet (5 mg total) by mouth 3 (three) times daily. 07/04/18   Wendie Agreste, MD   Social History   Socioeconomic History  . Marital status: Married    Spouse name: Racquel  . Number of children: Not on file  . Years of education: Not on file  . Highest education level: Not on file  Occupational History  . Occupation: [employer] Scientist, research (physical sciences)  Social Needs  . Financial resource strain: Not on file  . Food insecurity    Worry: Not on file    Inability: Not on file  . Transportation needs    Medical: Not on file    Non-medical: Not on file  Tobacco Use  . Smoking status: Former Research scientist (life sciences)  . Smokeless tobacco: Never Used  Substance and Sexual Activity  . Alcohol use: No  . Drug use: No  . Sexual activity: Not on file  Lifestyle  . Physical activity    Days per week: Not on file    Minutes per session: Not on file  . Stress: Not on file  Relationships  . Social Herbalist on phone: Not on file    Gets together: Not on file    Attends religious service:  Not on file    Active member of club or organization: Not on file    Attends meetings of clubs or organizations: Not on file    Relationship status: Not on file  . Intimate partner violence    Fear of current or ex partner: Not on file    Emotionally abused: Not on file    Physically abused: Not on file    Forced sexual activity: Not on file  Other Topics Concern  . Not on file  Social History Narrative  . Not on file    Review of Systems Per HPI  Objective:   Vitals:   12/26/18 1412  BP: 136/77  Pulse: 78  Temp: 98.5 F (36.9 C)  TempSrc: Oral  SpO2: 97%  Weight: 289 lb 6.4 oz (131.3 kg)     Physical Exam Vitals signs reviewed.  Constitutional:      Appearance: He is well-developed.  HENT:     Head: Normocephalic and atraumatic.  Eyes:     Pupils: Pupils are equal, round, and reactive to light.   Neck:     Vascular: No carotid bruit or JVD.  Cardiovascular:     Rate and Rhythm: Normal rate and regular rhythm.     Heart sounds: Normal heart sounds. No murmur.  Pulmonary:     Effort: Pulmonary effort is normal.     Breath sounds: Normal breath sounds. No rales.  Skin:    General: Skin is warm and dry.  Neurological:     Mental Status: He is alert and oriented to person, place, and time.     Cranial Nerves: No cranial nerve deficit, dysarthria or facial asymmetry.     Sensory: No sensory deficit.     Motor: Motor function is intact. No weakness, tremor or pronator drift.     Coordination: Coordination is intact. Romberg sign negative. Coordination normal. Finger-Nose-Finger Test normal.     Gait: Gait is intact.        Assessment & Plan:  Adam Alvarez is a 33 y.o. male . Vitamin D deficiency - Plan: Vitamin D, Ergocalciferol, (DRISDOL) 1.25 MG (50000 UT) CAPS capsule  -Significant low reading recently, start 50,000 units weekly, recheck levels in 6 weeks, can be done at [facility] weight management.  Other glaucoma of right eye Recurrent headache - Plan: AMB referral to headache clinic  -Importance of ophthalmology follow-up as well as compliance with prostaglandin drops for treatment of glaucoma, potential risk of untreated glaucoma discussed.  -Possible migraines including ophthalmic migraine with recent symptoms.  Refer to headache specialist.  Prevention techniques discussed including sleep discussion, caffeine, hydration.  -RTC/ER precautions  No orders of the defined types were placed in this encounter.  There are no Patient Instructions on file for this visit.    Signed, Meredith Staggers, MD [facility]

## 2018-12-27 ENCOUNTER — Encounter: Payer: Self-pay | Admitting: Family Medicine

## 2019-01-02 ENCOUNTER — Ambulatory Visit (INDEPENDENT_AMBULATORY_CARE_PROVIDER_SITE_OTHER): Payer: Managed Care, Other (non HMO) | Admitting: Bariatrics

## 2019-01-02 ENCOUNTER — Encounter (INDEPENDENT_AMBULATORY_CARE_PROVIDER_SITE_OTHER): Payer: Self-pay

## 2019-01-10 ENCOUNTER — Telehealth: Payer: Self-pay | Admitting: Family Medicine

## 2019-01-10 NOTE — Telephone Encounter (Signed)
FAXED REFERRAL INFO TO [facility] REGARDING THIS PT ON 11.24.2020 FR

## 2019-03-22 ENCOUNTER — Ambulatory Visit: Payer: Managed Care, Other (non HMO) | Admitting: Registered Nurse

## 2019-05-30 ENCOUNTER — Encounter: Payer: Self-pay | Admitting: Registered Nurse

## 2019-05-30 ENCOUNTER — Ambulatory Visit (INDEPENDENT_AMBULATORY_CARE_PROVIDER_SITE_OTHER): Payer: Managed Care, Other (non HMO)

## 2019-05-30 ENCOUNTER — Other Ambulatory Visit: Payer: Self-pay

## 2019-05-30 ENCOUNTER — Ambulatory Visit (INDEPENDENT_AMBULATORY_CARE_PROVIDER_SITE_OTHER): Payer: Managed Care, Other (non HMO) | Admitting: Registered Nurse

## 2019-05-30 VITALS — BP 125/71 | HR 86 | Temp 97.8°F | Resp 16 | Ht 75.0 in | Wt 292.0 lb

## 2019-05-30 DIAGNOSIS — M25551 Pain in right hip: Secondary | ICD-10-CM

## 2019-05-30 DIAGNOSIS — M5431 Sciatica, right side: Secondary | ICD-10-CM

## 2019-05-30 DIAGNOSIS — E559 Vitamin D deficiency, unspecified: Secondary | ICD-10-CM

## 2019-05-30 MED ORDER — KETOROLAC TROMETHAMINE 60 MG/2ML IM SOLN
60.0000 mg | Freq: Once | INTRAMUSCULAR | Status: AC
  Administered 2019-05-30: 60 mg via INTRAMUSCULAR

## 2019-05-30 MED ORDER — TRAMADOL HCL 50 MG PO TABS: 50.0000 mg | ORAL_TABLET | Freq: Three times a day (TID) | ORAL | 0 refills | Status: AC | PRN

## 2019-05-30 MED ORDER — METHOCARBAMOL 500 MG PO TABS: 500.0000 mg | ORAL_TABLET | Freq: Four times a day (QID) | ORAL | 0 refills | Status: DC

## 2019-05-30 MED ORDER — KETOROLAC TROMETHAMINE 30 MG/ML IJ SOLN: 30.0000 mg | Freq: Once | INTRAMUSCULAR | Status: DC

## 2019-05-30 MED ORDER — GABAPENTIN 100 MG PO CAPS: 100.0000 mg | ORAL_CAPSULE | Freq: Three times a day (TID) | ORAL | 0 refills | Status: DC

## 2019-05-30 NOTE — Patient Instructions (Addendum)
Adam Alvarez -   Let's get you some relief:  Tramadol: take 1 tablet (50mg ) once every 8 hours as needed for pain  Methocarbamol: take 1 tablet (500mg ) once every 6 hours as needed for pain or muscle spasm  Gabapentin: Take 1 tablet (100mg ) three times daily as needed for sciatic pain (shooting pain down your leg). You may take an additional tablet before bed if needed.  As of my writing this, we are still awaiting the results of your Xray. Should the results change this plan, I will confirm any changes with you.  Please let me know if you have any questions.    If you have lab work done today you will be contacted with your lab results within the next 2 weeks.  If you have not heard from then please contact . The fastest way to get your results is to register for My Chart.   IF you received an x-ray today, you will receive an invoice from [facility]. Please contact [facility] at [phone] with questions or concerns regarding your invoice.   IF you received labwork today, you will receive an invoice from [facility]. Please contact [facility] at [phone] with questions or concerns regarding your invoice.   Our billing staff will not be able to assist you with questions regarding bills from these companies.  You will be contacted with the lab results as soon as they are available. The fastest way to get your results is to activate your My Chart account. Instructions are located on the last page of this paperwork. If you have not heard from [phone] regarding the results in 2 weeks, please contact this office.

## 2019-05-30 NOTE — Progress Notes (Signed)
Acute Office Visit  Subjective:    Patient ID: Adam Alvarez, male    DOB: 27-Mar-1985, 34 y.o.   MRN: [id number]  Chief Complaint  Patient presents with  . Hip Pain    RIGHT x 1 week pain radiates down leg    HPI Patient is in today for acute R hip pain  Onset around 1 week ago, worsening since Lateral and posterior. Dull ache constantly with sharp pain with sudden movements Radiation down the leg - sharp, stabbing pain No swelling, redness, tenderness in hip or rle Has not happened before Does not remember acute injury Drives for [employer], has had trouble driving since pain onset.   No further complaints  Past Medical History:  Diagnosis Date  . Back pain   . Glaucoma   . Hyperhydrosis disorder   . Hypertension   . Lactose intolerance   . Migraine     Past Surgical History:  Procedure Laterality Date  . JOINT REPLACEMENT    . KNEE SURGERY      Family History  Problem Relation Age of Onset  . High blood pressure Mother   . Obesity Mother     Social History   Socioeconomic History  . Marital status: Married    Spouse name: Racquel  . Number of children: Not on file  . Years of education: Not on file  . Highest education level: Not on file  Occupational History  . 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 Smoking status: Former Games developer  . Smokeless tobacco: Never Used  Substance and Sexual Activity  . Alcohol use: No  . Drug use: No  . Sexual activity: Not on file  Other Topics Concern  . Not on file  Social History Narrative  . Not on file   Social Determinants of Health   Financial Resource Strain:   . Difficulty of Paying Living Expenses:   Food Insecurity:   . Worried About Programme researcher, broadcasting/film/video in the Last Year:   . Barista in the Last Year:   Transportation Needs:   . Freight forwarder (Medical):   Marland Kitchen Lack of Transportation (Non-Medical):   Physical Activity:   . Days of Exercise per Week:   . Minutes of  Exercise per Session:   Stress:   . Feeling of Stress :   Social Connections:   . Frequency of Communication with Friends and Family:   . Frequency of Social Gatherings with Friends and Family:   . Attends Religious Services:   . Active Member of Clubs or Organizations:   . Attends Banker Meetings:   Marland Kitchen Marital Status:   Intimate Partner Violence:   . Fear of Current or Ex-Partner:   . Emotionally Abused:   Marland Kitchen Physically Abused:   . Sexually Abused:     Outpatient Medications Prior to Visit  Medication Sig Dispense Refill  . oxybutynin (DITROPAN) 5 MG tablet Take 1 tablet (5 mg total) by mouth 3 (three) times daily. 90 tablet 2  . Vitamin D, Ergocalciferol, (DRISDOL) 1.25 MG (50000 UT) CAPS capsule Take 1 capsule (50,000 Units total) by mouth every 7 (seven) days. (Patient not taking: Reported on 05/30/2019) 15 capsule 0   No facility-administered medications prior to visit.    No Known Allergies  Review of Systems  Constitutional: Negative.   HENT: Negative.   Eyes: Negative.   Respiratory: Negative.   Cardiovascular: Negative.   Gastrointestinal: Negative.   Endocrine:  Negative.   Genitourinary: Negative.   Musculoskeletal: Positive for arthralgias and gait problem. Negative for back pain, joint swelling, myalgias, neck pain and neck stiffness.  Skin: Negative.   Allergic/Immunologic: Negative.   Hematological: Negative.   Psychiatric/Behavioral: Negative.   All other systems reviewed and are negative.      Objective:    Physical Exam Vitals and nursing note reviewed.  Constitutional:      General: He is not in acute distress.    Appearance: Normal appearance. He is obese. He is not ill-appearing, toxic-appearing or diaphoretic.  Cardiovascular:     Rate and Rhythm: Normal rate and regular rhythm.  Pulmonary:     Effort: Pulmonary effort is normal. No respiratory distress.  Musculoskeletal:        General: Tenderness and signs of injury present.  No swelling or deformity.     Right lower leg: No edema.     Left lower leg: No edema.     Comments: rom in R hip greatly limited dt pain and guarding. No aspect of active or passive ROM intact - testing made difficult as a result  Skin:    General: Skin is warm and dry.     Capillary Refill: Capillary refill takes less than 2 seconds.     Coloration: Skin is not jaundiced or pale.     Findings: No bruising, erythema, lesion or rash.  Neurological:     General: No focal deficit present.     Mental Status: He is alert and oriented to person, place, and time. Mental status is at baseline.     Cranial Nerves: No cranial nerve deficit.  Psychiatric:        Mood and Affect: Mood normal.        Behavior: Behavior normal.        Thought Content: Thought content normal.        Judgment: Judgment normal.     BP 125/71   Pulse 86   Temp 97.8 F (36.6 C) (Temporal)   Resp 16   Ht 6\' 3"  (1.905 m)   Wt 292 lb (132.5 kg)   SpO2 97%   BMI 36.50 kg/m  Wt Readings from Last 3 Encounters:  05/30/19 292 lb (132.5 kg)  12/26/18 289 lb 6.4 oz (131.3 kg)  12/19/18 283 lb (128.4 kg)    There are no preventive care reminders to display for this patient.  There are no preventive care reminders to display for this patient.   Lab Results  Component Value Date   TSH 1.450 04/07/2018   Lab Results  Component Value Date   WBC 3.7 (A) 04/07/2018   HGB 14.7 (A) 04/07/2018   HCT 43.6 (A) 04/07/2018   MCV 87.5 04/07/2018   PLT 217 04/11/2017   Lab Results  Component Value Date   NA 138 12/19/2018   K 4.8 12/19/2018   CO2 27 12/19/2018   GLUCOSE 93 12/19/2018   BUN 14 12/19/2018   CREATININE 1.43 (H) 12/19/2018   BILITOT 0.6 12/19/2018   ALKPHOS 61 12/19/2018   AST 21 12/19/2018   ALT 38 12/19/2018   PROT 7.0 12/19/2018   ALBUMIN 4.5 12/19/2018   CALCIUM 9.6 12/19/2018   ANIONGAP 9 04/11/2017   Lab Results  Component Value Date   CHOL 218 (H) 12/19/2018   Lab Results   Component Value Date   HDL 37 (L) 12/19/2018   Lab Results  Component Value Date   LDLCALC 134 (H) 12/19/2018   Lab Results  Component Value Date   TRIG 259 (H) 12/19/2018   No results found for: CHOLHDL Lab Results  Component Value Date   HGBA1C 5.4 12/19/2018       Assessment & Plan:   Problem List Items Addressed This Visit      Other   Vitamin D deficiency   Relevant Medications   Vitamin D, Ergocalciferol, (DRISDOL) 1.25 MG (50000 UNIT) CAPS capsule   Other Relevant Orders   VITAMIN D 25 Hydroxy (Vit-D Deficiency, Fractures) (Completed)    Other Visit Diagnoses    Acute pain of right hip    -  Primary   Relevant Medications   traMADol (ULTRAM) 50 MG tablet   methocarbamol (ROBAXIN) 500 MG tablet   gabapentin (NEURONTIN) 100 MG capsule   ketorolac (TORADOL) injection 60 mg (Completed)   Other Relevant Orders   DG Hip Unilat W OR W/O Pelvis 2-3 Views Right (Completed)   CT HIP RIGHT W WO CONTRAST   Right sciatic nerve pain       Relevant Medications   methocarbamol (ROBAXIN) 500 MG tablet   gabapentin (NEURONTIN) 100 MG capsule       No orders of the defined types were placed in this encounter.  PLAN  Xray shows suspicion for fx of femoral neck - will order CT, hoping pt can get this done asap  Restart weekly vit d supplement  Ketorolac 60mg  IM injection given  Tramadol, gabapentin, and methocarbamol given for relief at home  No work until CT done and plan determined  Patient encouraged to call clinic with any questions, comments, or concerns.  Maximiano Coss, NP

## 2019-05-31 ENCOUNTER — Encounter: Payer: Self-pay | Admitting: Registered Nurse

## 2019-05-31 ENCOUNTER — Other Ambulatory Visit: Payer: Self-pay | Admitting: Registered Nurse

## 2019-05-31 LAB — VITAMIN D 25 HYDROXY (VIT D DEFICIENCY, FRACTURES): Vit D, 25-Hydroxy: 13.3 ng/mL — ABNORMAL LOW (ref 30.0–100.0)

## 2019-05-31 MED ORDER — VITAMIN D (ERGOCALCIFEROL) 1.25 MG (50000 UNIT) PO CAPS: 50000.0000 [IU] | ORAL_CAPSULE | ORAL | 0 refills | Status: DC

## 2019-06-02 ENCOUNTER — Ambulatory Visit
Admission: RE | Admit: 2019-06-02 | Discharge: 2019-06-02 | Disposition: A | Discharge status: Home / Self Care | Payer: Managed Care, Other (non HMO) | Source: Ambulatory Visit | Attending: Registered Nurse | Admitting: Registered Nurse

## 2019-06-02 DIAGNOSIS — M25551 Pain in right hip: Secondary | ICD-10-CM

## 2019-06-02 NOTE — Progress Notes (Signed)
Hello,  If we could give Mr. Chagoya a call to let him know that he does not have a hip fracture, just some mild bone spurs, that would be great. He should continue pain control and rest.  Thank you Jari Sportsman, NP

## 2019-06-07 ENCOUNTER — Encounter: Payer: Self-pay | Admitting: Registered Nurse

## 2019-06-08 ENCOUNTER — Other Ambulatory Visit: Payer: Self-pay | Admitting: Registered Nurse

## 2019-06-08 DIAGNOSIS — M25551 Pain in right hip: Secondary | ICD-10-CM

## 2019-06-08 NOTE — Telephone Encounter (Signed)
Pt is following up with you regarding his Rt hip pain, has been trying to do some light activities since seeing you, had his imagining done showing degenerative changes pt states he does not want to be on pain medication for a long time wants to know if theres something else he can do or if he just needs to be patient

## 2019-06-15 ENCOUNTER — Encounter: Payer: Self-pay | Admitting: Orthopaedic Surgery

## 2019-06-15 ENCOUNTER — Ambulatory Visit (INDEPENDENT_AMBULATORY_CARE_PROVIDER_SITE_OTHER): Payer: Managed Care, Other (non HMO) | Admitting: Orthopaedic Surgery

## 2019-06-15 ENCOUNTER — Other Ambulatory Visit: Payer: Self-pay

## 2019-06-15 DIAGNOSIS — M1611 Unilateral primary osteoarthritis, right hip: Secondary | ICD-10-CM | POA: Diagnosis not present

## 2019-06-15 DIAGNOSIS — G8929 Other chronic pain: Secondary | ICD-10-CM | POA: Diagnosis not present

## 2019-06-15 DIAGNOSIS — M5441 Lumbago with sciatica, right side: Secondary | ICD-10-CM

## 2019-06-15 MED ORDER — PREDNISONE 10 MG (21) PO TBPK: ORAL_TABLET | ORAL | 0 refills | Status: DC

## 2019-06-15 NOTE — Progress Notes (Signed)
Office Visit Note   Patient: Adam Alvarez           Date of Birth: Nov 22, 1985           MRN: [id number] Visit Date: 06/15/2019              Requested by: [name], NP [address] [place],  [place] [address] PCP: Shade Flood, MD   Assessment & Plan: Visit Diagnoses:  1. Chronic midline low back pain with right-sided sciatica   2. Unilateral primary osteoarthritis, right hip     Plan: Impression is right-sided lumbar radiculopathy as well as underlying right hip osteoarthritis.  I believe majority of patient's symptoms are actually coming from his back.  I will start him on a Sterapred taper and physical therapy.  External prescription has been sent in.  He will follow-up with [place] in 8 weeks time for recheck.  We will need to get updated lumbar spine x-rays at that time.  Call with concerns or questions in the meantime.  Follow-Up Instructions: Return in about 8 weeks (around 08/10/2019).   Orders:  Orders Placed This Encounter  Procedures   Ambulatory referral to Physical Therapy   Meds ordered this encounter  Medications   predniSONE (STERAPRED UNI-PAK 21 TAB) 10 MG (21) TBPK tablet    Sig: Take as directed    Dispense:  21 tablet    Refill:  0      Procedures: No procedures performed   Clinical Data: No additional findings.   Subjective: Chief Complaint  Patient presents with   Right Hip - Pain    HPI patient is a pleasant 34 year old gentleman who comes in today for evaluation of chronic right hip pain.  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  The pain he has is actually to the middle of his lower back and radiates down the lateral right hip into the anterior lateral leg and foot.  He notes burning into his toes.  He notes that his pain is aggravated when he is sitting or standing for a long period of time.  When he is moving around, his symptoms do  improve.  He has been taking gabapentin, tramadol and Robaxin with moderate relief of symptoms.  Review of Systems as detailed in HPI.  All other reviewed and are negative.   Objective: Vital Signs: There were no vitals taken for this visit.  Physical Exam well-developed and well-nourished gentleman in no acute distress.  Alert and oriented x3.  Ortho Exam examination of his lumbar spine reveals mild lower lumbar spinous tenderness.  No paraspinous tenderness.  No pain with lumbar flexion extension.  He does have a markedly positive straight leg raise.  He does have a positive logroll as well.  No focal weakness.  He is neurovascular intact distally.  Specialty Comments:  No specialty comments available.  Imaging: X-rays of the right hip reviewed by me in canopy reveal mild degenerative changes.  Remote lumbar spine x-rays reveal an abnormal straightening of the lumbar spine.   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

## 2019-06-20 ENCOUNTER — Encounter: Payer: Self-pay | Admitting: Registered Nurse

## 2019-06-20 NOTE — Telephone Encounter (Signed)
Please Advise

## 2019-06-29 ENCOUNTER — Encounter: Payer: Self-pay | Admitting: Family Medicine

## 2019-06-29 ENCOUNTER — Encounter: Payer: Self-pay | Admitting: Registered Nurse

## 2019-06-29 ENCOUNTER — Ambulatory Visit: Payer: Managed Care, Other (non HMO)

## 2019-06-29 ENCOUNTER — Other Ambulatory Visit: Payer: Self-pay | Admitting: Registered Nurse

## 2019-06-29 DIAGNOSIS — M5431 Sciatica, right side: Secondary | ICD-10-CM

## 2019-06-29 NOTE — Telephone Encounter (Signed)
Closing encounter, pt will contact pcp next week regarding back brace

## 2019-06-30 NOTE — Telephone Encounter (Signed)
Patient wanted to seen you a copy of some paperwork that he would like for you to look at from awhile back. Please Advise

## 2019-07-03 NOTE — Telephone Encounter (Signed)
Called patient the only thing he needed at this time was the Fax number and also that he is still out of work due to the restrictions. Patient states the he will contact the office if there is any additional information needed.

## 2019-07-07 ENCOUNTER — Other Ambulatory Visit: Payer: Self-pay | Admitting: Registered Nurse

## 2019-07-07 DIAGNOSIS — G8929 Other chronic pain: Secondary | ICD-10-CM

## 2019-07-10 NOTE — Telephone Encounter (Signed)
I have these forms and currently filling out

## 2019-07-11 ENCOUNTER — Other Ambulatory Visit: Payer: Self-pay

## 2019-07-11 ENCOUNTER — Ambulatory Visit
Admission: RE | Admit: 2019-07-11 | Discharge: 2019-07-11 | Disposition: A | Discharge status: Home / Self Care | Payer: Managed Care, Other (non HMO) | Source: Ambulatory Visit | Attending: Registered Nurse | Admitting: Registered Nurse

## 2019-07-11 NOTE — Telephone Encounter (Signed)
Called the patient to let him know that paperwork was faxed over earlier and to let us know if the employer didn't receive them because I have a copy in the office

## 2019-07-19 ENCOUNTER — Encounter: Payer: Self-pay | Admitting: Registered Nurse

## 2019-07-25 NOTE — Telephone Encounter (Signed)
I have received the paper work and going to work on it.

## 2019-08-02 NOTE — Telephone Encounter (Signed)
Pt states the paperwork for short term disability is missing something and has attached a clean copy to his message he also mentions a fee he needs to pay, he will need to call to pay the $15

## 2019-08-02 NOTE — Telephone Encounter (Signed)
If we could get it printed and fill out what you can, that would be great. It can be placed in my inbox after that -   Thank you!  Rich

## 2019-08-08 ENCOUNTER — Encounter: Payer: Self-pay | Admitting: Registered Nurse

## 2019-08-08 ENCOUNTER — Other Ambulatory Visit: Payer: Self-pay

## 2019-08-08 ENCOUNTER — Ambulatory Visit (INDEPENDENT_AMBULATORY_CARE_PROVIDER_SITE_OTHER): Payer: Managed Care, Other (non HMO) | Admitting: Registered Nurse

## 2019-08-08 VITALS — BP 127/81 | HR 74 | Temp 97.7°F | Resp 18 | Ht 75.0 in | Wt 287.2 lb

## 2019-08-08 DIAGNOSIS — R61 Generalized hyperhidrosis: Secondary | ICD-10-CM

## 2019-08-08 DIAGNOSIS — M25551 Pain in right hip: Secondary | ICD-10-CM

## 2019-08-08 MED ORDER — PREDNISONE 10 MG (21) PO TBPK: ORAL_TABLET | ORAL | 0 refills | Status: DC

## 2019-08-08 MED ORDER — GLYCOPYRROLATE 1 MG PO TABS: 1.0000 mg | ORAL_TABLET | Freq: Every day | ORAL | 1 refills | Status: DC

## 2019-08-08 MED ORDER — METHOCARBAMOL 500 MG PO TABS: 500.0000 mg | ORAL_TABLET | Freq: Four times a day (QID) | ORAL | 0 refills | Status: DC

## 2019-08-08 NOTE — Patient Instructions (Signed)
° ° ° °  If you have lab work done today you will be contacted with your lab results within the next 2 weeks.  If you have not heard from us then please contact us. The fastest way to get your results is to register for My Chart. ° ° °IF you received an x-ray today, you will receive an invoice from [facility]. Please contact [facility] at [phone] with questions or concerns regarding your invoice.  ° °IF you received labwork today, you will receive an invoice from [facility]. Please contact [facility] at [phone] with questions or concerns regarding your invoice.  ° °Our billing staff will not be able to assist you with questions regarding bills from these companies. ° °You will be contacted with the lab results as soon as they are available. The fastest way to get your results is to activate your My Chart account. Instructions are located on the last page of this paperwork. If you have not heard from us regarding the results in 2 weeks, please contact this office. °  ° ° ° °

## 2019-08-08 NOTE — Progress Notes (Signed)
Established Patient Office Visit  Subjective:  Patient ID: Adam Alvarez, male    DOB: Aug 07, 1985  Age: 34 y.o. MRN: [id number]  CC:  Chief Complaint  Patient presents with  . Back Pain    Patient states he is still having back pain and its mostly when hes sitting still but not when his moving. Per patient he takes tylenol for pain but no relief  . Arm Pain    patient states that his right arm is starting to get still and have tingling sensation    HPI Adam Alvarez presents for ongoing lower back pain  Has not improved much. Had done 3 PT sessions but unfortunately he could not afford this at $100/session. Has been seeing a chiropractor instead who he states has been somewhat helpful Does not feel ready to return to work, cannot bend, lift heavy objects, or sit still/drive for any substantial period of time.   Has FMLA paperwork with him today.  Past Medical History:  Diagnosis Date  . Back pain   . Glaucoma   . Hyperhydrosis disorder   . Hypertension   . Lactose intolerance   . Migraine     Past Surgical History:  Procedure Laterality Date  . JOINT REPLACEMENT    . KNEE SURGERY      Family History  Problem Relation Age of Onset  . High blood pressure Mother   . Obesity Mother     Social History   Socioeconomic History  . Marital status: Married    Spouse name: Racquel  . Number of children: Not on file  . Years of education: Not on file  . Highest education level: Not on file  Occupational History  . 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 Smoking status: Former Research scientist (life sciences)  . Smokeless tobacco: Never Used  Substance and Sexual Activity  . Alcohol use: No  . Drug use: No  . Sexual activity: Not on file  Other Topics Concern  . Not on file  Social History Narrative  . Not on file   Social Determinants of Health   Financial Resource Strain:   . Difficulty of Paying Living Expenses:   Food Insecurity:   . Worried About Paediatric nurse in the Last Year:   . Arboriculturist in the Last Year:   Transportation Needs:   . Film/video editor (Medical):   Marland Kitchen Lack of Transportation (Non-Medical):   Physical Activity:   . Days of Exercise per Week:   . Minutes of Exercise per Session:   Stress:   . Feeling of Stress :   Social Connections:   . Frequency of Communication with Friends and Family:   . Frequency of Social Gatherings with Friends and Family:   . Attends Religious Services:   . Active Member of Clubs or Organizations:   . Attends Archivist Meetings:   Marland Kitchen Marital Status:   Intimate Partner Violence:   . Fear of Current or Ex-Partner:   . Emotionally Abused:   Marland Kitchen Physically Abused:   . Sexually Abused:     Outpatient Medications Prior to Visit  Medication Sig Dispense Refill  . oxybutynin (DITROPAN) 5 MG tablet Take 1 tablet (5 mg total) by mouth 3 (three) times daily. 90 tablet 2  . gabapentin (NEURONTIN) 100 MG capsule Take 1 capsule (100 mg total) by mouth 3 (three) times daily. (Patient not taking: Reported on 08/08/2019) 90 capsule 0  .  methocarbamol (ROBAXIN) 500 MG tablet Take 1 tablet (500 mg total) by mouth 4 (four) times daily. (Patient not taking: Reported on 08/08/2019) 60 tablet 0  . predniSONE (STERAPRED UNI-PAK 21 TAB) 10 MG (21) TBPK tablet Take as directed (Patient not taking: Reported on 08/08/2019) 21 tablet 0  . Vitamin D, Ergocalciferol, (DRISDOL) 1.25 MG (50000 UNIT) CAPS capsule Take 1 capsule (50,000 Units total) by mouth every 7 (seven) days. (Patient not taking: Reported on 08/08/2019) 15 capsule 0   No facility-administered medications prior to visit.    No Known Allergies  ROS Review of Systems Per hpi     Objective:    Physical Exam Vitals and nursing note reviewed.  Constitutional:      General: He is not in acute distress.    Appearance: Normal appearance. He is obese. He is not ill-appearing, toxic-appearing or diaphoretic.  Cardiovascular:      Rate and Rhythm: Normal rate and regular rhythm.  Pulmonary:     Effort: Pulmonary effort is normal. No respiratory distress.  Musculoskeletal:        General: Tenderness (midline lower back) present. No swelling, deformity or signs of injury. Normal range of motion.     Right lower leg: No edema.     Left lower leg: No edema.  Skin:    General: Skin is warm and dry.     Coloration: Skin is not jaundiced or pale.     Findings: No bruising, erythema, lesion or rash.  Neurological:     General: No focal deficit present.     Mental Status: He is alert and oriented to person, place, and time. Mental status is at baseline.  Psychiatric:        Mood and Affect: Mood normal.        Behavior: Behavior normal.        Thought Content: Thought content normal.        Judgment: Judgment normal.     BP 127/81   Pulse 74   Temp 97.7 F (36.5 C) (Temporal)   Resp 18   Ht 6\' 3"  (1.905 m)   Wt 287 lb 3.2 oz (130.3 kg)   SpO2 97%   BMI 35.90 kg/m  Wt Readings from Last 3 Encounters:  08/08/19 287 lb 3.2 oz (130.3 kg)  05/30/19 292 lb (132.5 kg)  12/26/18 289 lb 6.4 oz (131.3 kg)     Health Maintenance Due  Topic Date Due  . Hepatitis C Screening  Never done    There are no preventive care reminders to display for this patient.  Lab Results  Component Value Date   TSH 1.450 04/07/2018   Lab Results  Component Value Date   WBC 3.7 (A) 04/07/2018   HGB 14.7 (A) 04/07/2018   HCT 43.6 (A) 04/07/2018   MCV 87.5 04/07/2018   PLT 217 04/11/2017   Lab Results  Component Value Date   NA 138 12/19/2018   K 4.8 12/19/2018   CO2 27 12/19/2018   GLUCOSE 93 12/19/2018   BUN 14 12/19/2018   CREATININE 1.43 (H) 12/19/2018   BILITOT 0.6 12/19/2018   ALKPHOS 61 12/19/2018   AST 21 12/19/2018   ALT 38 12/19/2018   PROT 7.0 12/19/2018   ALBUMIN 4.5 12/19/2018   CALCIUM 9.6 12/19/2018   ANIONGAP 9 04/11/2017   Lab Results  Component Value Date   CHOL 218 (H) 12/19/2018   Lab  Results  Component Value Date   HDL 37 (L) 12/19/2018  Lab Results  Component Value Date   LDLCALC 134 (H) 12/19/2018   Lab Results  Component Value Date   TRIG 259 (H) 12/19/2018   No results found for: [facility] Lab Results  Component Value Date   HGBA1C 5.4 12/19/2018      Assessment & Plan:   Problem List Items Addressed This Visit      Musculoskeletal and Integument   Hyperhidrosis - Primary   Relevant Medications   glycopyrrolate (ROBINUL) 1 MG tablet      Meds ordered this encounter  Medications  . glycopyrrolate (ROBINUL) 1 MG tablet    Sig: Take 1 tablet (1 mg total) by mouth daily.    Dispense:  90 tablet    Refill:  1    Order Specific Question:   Supervising Provider    Answer:   Georgina Quint [[id number]]    Follow-up: No follow-ups on file.   PLAN  He is due to follow up with Ortho - while I will fill out his paperwork, he needs to discuss his lack of improvement from them  Try robinul rather than oxybutinin for hyperhidrosis  Return prn  Patient encouraged to call clinic with any questions, comments, or concerns.  [name], NP

## 2019-08-08 NOTE — Telephone Encounter (Signed)
Pt was seen in the office today to address the paperwork.

## 2019-09-14 ENCOUNTER — Telehealth: Payer: Self-pay | Admitting: Family Medicine

## 2019-09-14 NOTE — Telephone Encounter (Signed)
Fax came in from [place] for disability for pt. Paper work was put in Chartered loss adjuster beside nurses station. Please advise.

## 2019-09-15 ENCOUNTER — Telehealth: Payer: Self-pay | Admitting: Orthopaedic Surgery

## 2019-09-15 NOTE — Telephone Encounter (Signed)
I have the patients paperwork and will complete it today, but will have to wait for [place]'s signature.

## 2019-09-15 NOTE — Telephone Encounter (Signed)
[place] forms received. Sent to [employer] 

## 2019-10-01 ENCOUNTER — Other Ambulatory Visit: Payer: Self-pay | Admitting: Family Medicine

## 2019-10-01 DIAGNOSIS — L749 Eccrine sweat disorder, unspecified: Secondary | ICD-10-CM

## 2019-10-01 NOTE — Telephone Encounter (Signed)
Requested Prescriptions  Pending Prescriptions Disp Refills  . oxybutynin (DITROPAN) 5 MG tablet [Pharmacy Med Name: Oxybutynin Chloride 5 MG Oral Tablet] 90 tablet 2    Sig: TAKE 1 TABLET BY MOUTH THREE TIMES DAILY     Urology:  Bladder Agents Passed - 10/01/2019  3:04 PM      Passed - Valid encounter within last 12 months    Recent Outpatient Visits          1 month ago Hyperhidrosis   [facility], Richard, NP   4 months ago Acute pain of right hip   [facility], Gerlene Burdock, NP   9 months ago Vitamin D deficiency   [facility], Asencion Partridge, MD   1 year ago Weight gain   [facility], Asencion Partridge, MD   1 year ago Encounter for commercial driver medical examination (CDME)   [facility], Asencion Partridge, MD

## 2019-10-12 ENCOUNTER — Other Ambulatory Visit: Payer: Self-pay

## 2019-10-12 ENCOUNTER — Encounter: Payer: Self-pay | Admitting: Emergency Medicine

## 2019-10-12 ENCOUNTER — Ambulatory Visit (INDEPENDENT_AMBULATORY_CARE_PROVIDER_SITE_OTHER): Payer: Managed Care, Other (non HMO) | Admitting: Emergency Medicine

## 2019-10-12 VITALS — BP 128/85 | HR 93 | Temp 97.9°F | Resp 16 | Ht 75.0 in | Wt 288.0 lb

## 2019-10-12 DIAGNOSIS — Z024 Encounter for examination for driving license: Secondary | ICD-10-CM

## 2019-10-12 NOTE — Patient Instructions (Addendum)
   If you have lab work done today you will be contacted with your lab results within the next 2 weeks.  If you have not heard from us then please contact us. The fastest way to get your results is to register for My Chart.   IF you received an x-ray today, you will receive an invoice from [facility]. Please contact [facility] at [phone] with questions or concerns regarding your invoice.   IF you received labwork today, you will receive an invoice from [facility]. Please contact [facility] at [phone] with questions or concerns regarding your invoice.   Our billing staff will not be able to assist you with questions regarding bills from these companies.  You will be contacted with the lab results as soon as they are available. The fastest way to get your results is to activate your My Chart account. Instructions are located on the last page of this paperwork. If you have not heard from us regarding the results in 2 weeks, please contact this office.      Health Maintenance, Male Adopting a healthy lifestyle and getting preventive care are important in promoting health and wellness. Ask your health care provider about:  The right schedule for you to have regular tests and exams.  Things you can do on your own to prevent diseases and keep yourself healthy. What should I know about diet, weight, and exercise? Eat a healthy diet   Eat a diet that includes plenty of vegetables, fruits, low-fat dairy products, and lean protein.  Do not eat a lot of foods that are high in solid fats, added sugars, or sodium. Maintain a healthy weight Body mass index (BMI) is a measurement that can be used to identify possible weight problems. It estimates body fat based on height and weight. Your health care provider can help determine your BMI and help you achieve or maintain a healthy weight. Get regular exercise Get regular exercise. This is one of the most important things you  can do for your health. Most adults should:  Exercise for at least 150 minutes each week. The exercise should increase your heart rate and make you sweat (moderate-intensity exercise).  Do strengthening exercises at least twice a week. This is in addition to the moderate-intensity exercise.  Spend less time sitting. Even light physical activity can be beneficial. Watch cholesterol and blood lipids Have your blood tested for lipids and cholesterol at 33 years of age, then have this test every 5 years. You may need to have your cholesterol levels checked more often if:  Your lipid or cholesterol levels are high.  You are older than 33 years of age.  You are at high risk for heart disease. What should I know about cancer screening? Many types of cancers can be detected early and may often be prevented. Depending on your health history and family history, you may need to have cancer screening at various ages. This may include screening for:  Colorectal cancer.  Prostate cancer.  Skin cancer.  Lung cancer. What should I know about heart disease, diabetes, and high blood pressure? Blood pressure and heart disease  High blood pressure causes heart disease and increases the risk of stroke. This is more likely to develop in people who have high blood pressure readings, are of African descent, or are overweight.  Talk with your health care provider about your target blood pressure readings.  Have your blood pressure checked: ? Every 3-5 years if you are 18-39   years of age. ? Every year if you are 40 years old or older.  If you are between the ages of 65 and 75 and are a current or former smoker, ask your health care provider if you should have a one-time screening for abdominal aortic aneurysm (AAA). Diabetes Have regular diabetes screenings. This checks your fasting blood sugar level. Have the screening done:  Once every three years after age 45 if you are at a normal weight and have  a low risk for diabetes.  More often and at a younger age if you are overweight or have a high risk for diabetes. What should I know about preventing infection? Hepatitis B If you have a higher risk for hepatitis B, you should be screened for this virus. Talk with your health care provider to find out if you are at risk for hepatitis B infection. Hepatitis C Blood testing is recommended for:  Everyone born from 1945 through 1965.  Anyone with known risk factors for hepatitis C. Sexually transmitted infections (STIs)  You should be screened each year for STIs, including gonorrhea and chlamydia, if: ? You are sexually active and are younger than 33 years of age. ? You are older than 33 years of age and your health care provider tells you that you are at risk for this type of infection. ? Your sexual activity has changed since you were last screened, and you are at increased risk for chlamydia or gonorrhea. Ask your health care provider if you are at risk.  Ask your health care provider about whether you are at high risk for HIV. Your health care provider may recommend a prescription medicine to help prevent HIV infection. If you choose to take medicine to prevent HIV, you should first get tested for HIV. You should then be tested every 3 months for as long as you are taking the medicine. Follow these instructions at home: Lifestyle  Do not use any products that contain nicotine or tobacco, such as cigarettes, e-cigarettes, and chewing tobacco. If you need help quitting, ask your health care provider.  Do not use street drugs.  Do not share needles.  Ask your health care provider for help if you need support or information about quitting drugs. Alcohol use  Do not drink alcohol if your health care provider tells you not to drink.  If you drink alcohol: ? Limit how much you have to 0-2 drinks a day. ? Be aware of how much alcohol is in your drink. In the [place] one drink equals one 12  oz bottle of beer (355 mL), one 5 oz glass of wine (148 mL), or one 1 oz glass of hard liquor (44 mL). General instructions  Schedule regular health, dental, and eye exams.  Stay current with your vaccines.  Tell your health care provider if: ? You often feel depressed. ? You have ever been abused or do not feel safe at home. Summary  Adopting a healthy lifestyle and getting preventive care are important in promoting health and wellness.  Follow your health care provider's instructions about healthy diet, exercising, and getting tested or screened for diseases.  Follow your health care provider's instructions on monitoring your cholesterol and blood pressure. This information is not intended to replace advice given to you by your health care provider. Make sure you discuss any questions you have with your health care provider. Document Revised: 01/26/2018 Document Reviewed: 01/26/2018 Elsevier Patient Education  2020 Elsevier Inc.  

## 2019-10-12 NOTE — Progress Notes (Signed)
This patient presents for DOT examination for fitness for duty.   Medical History:  1. Head/Brain Injuries, disorders or illnesses no  2. Seizures, epilepsy no  3. Eye disorders or impaired vision (except corrective lenses) no  4. Ear disorders, loss of hearing or balance no  5. Heart disease or heart attack, other cardiovascular condition no  6. Heart surgery (valve replacement/bypass, angioplasty, pacemaker/defribrillator) no  7. High blood pressure no  8. High cholesterol no  9. Chronic cough, shortness of breath or other breathing problems no  10. Lung disease (emphysema, asthma or chronic bronchitis) no  11. Kidney disease, dialysis no  12. Digestive problems  no  13. Diabetes or elevated blood sugar no                      If yes to #13, Insulin use n/a  14. Nervious or psychiatric disorders, e.g., severe depression no  15. Fainting or syncope no  16. Dizziness, headaches, numbness, tingling or memory loss no  17. Unexplained weight loss no  18. Stroke, TIA or paralysis no  19. Missing or impaired hand, arm, foot, leg, finger, toe no  20. Spinal injury or disease no  21. Bone, muscles or nerve problems no  22. Blood clots or bleeding bleeding disorders no  23. Cancer no  24. Chronic infection or other chronic diseases no  25. Sleep disorders, pauses in breathing while asleep, daytime sleepiness, loud snoring no  26. Have you ever had a sleep test? no  27.  Have you ever spent a night in the hospital? no  28. Have you ever had a broken bone? yes  29. Have you or or do you use tobacco products? no  30. Regular, frequent alcohol use no  31. Illegal substance use within the past 2 years no  32.  Have you ever failed a drug test or been dependent on an illegal substance? no   Current Medications: Prior to Admission medications   Medication Sig Start Date End Date Taking? Authorizing Provider  gabapentin (NEURONTIN) 100 MG capsule Take 1 capsule (100 mg total) by  mouth 3 (three) times daily. 05/30/19  Yes [name], NP  glycopyrrolate (ROBINUL) 1 MG tablet Take 1 tablet (1 mg total) by mouth daily. 08/08/19  Yes [name], NP  methocarbamol (ROBAXIN) 500 MG tablet Take 1 tablet (500 mg total) by mouth 4 (four) times daily. 08/08/19  Yes [name], NP  oxybutynin (DITROPAN) 5 MG tablet TAKE 1 TABLET BY MOUTH THREE TIMES DAILY 10/01/19  Yes Shade Flood, MD  predniSONE (STERAPRED UNI-PAK 21 TAB) 10 MG (21) TBPK tablet Take as directed 08/08/19  Yes [name], NP  Vitamin D, Ergocalciferol, (DRISDOL) 1.25 MG (50000 UNIT) CAPS capsule Take 1 capsule (50,000 Units total) by mouth every 7 (seven) days. 05/31/19  Yes [name], NP    Medical Examiner's Comments on Health History:  In good general medical condition  TESTING:  No exam data present  Monocular Vision: No.  Hearing Aid used for test: No. Hearing Aid required to to meet standard: No.  BP (!) 132/95    Pulse 93    Temp 97.9 F (36.6 C) (Temporal)    Resp 16    Ht 6\' 3"  (1.905 m)    Wt 288 lb (130.6 kg)    SpO2 96%    BMI 36.00 kg/m  Pulse rate is regular BP Readings from Last 3 Encounters:  10/12/19 (!) 132/95  08/08/19 127/81  05/30/19 125/71       PHYSICAL EXAMINATION:  General Appearance Not markedly obese. No tremor, signs of alcoholism, problem drinking or drug abuse.   Skin Warm, dry and intact.   Eyes Pupils are equal, round and reactive to light and accommodation, extraocular movements are intact. No exophthalmos, no nystagmus.   Ears Normal external ears. External canal without occlusion. No scarring of the TM. No perforation of the TM.  Mouth and Throat Clear and moist. No irremedial deformities likely to interfere with breathing or swallowing.  Heart No murmurs, extra sounds, evidence of cardiomegaly. No pacemaker. No implantable defibrillator.  Lungs and Chest (excluding breasts) Normal chest expansion, respiratory rate, breath sounds. No  cyanosis.  Abdomen and Viscera No liver enlargement. No splenic enlargement. No masses, bruits, hernias or significant abdominal wall weakness.  Genitourinary  No inguinal or femoral hernia.  Spine and other musculoskeletal No tenderness, no limitation of motion, no deformities. No evidence of previous surgery.  Extremities No loss or impairment of leg, foot, toe, arm, hand, finger. No perceptible limp, deformities, atrophy, weakness, paralysis, clubbing, edema, hypotonia. Patient has sufficient grasp and prehension to maintain steering wheel grip. Patient has sufficient mobility and strength in the lower limbs to operate pedals properly.  Neurologic Normal equilibrium, coordination, speech pattern. No paresthesia, asymmetry of deep tendon reflexes, sensory or positional abnormalities. No abnormality of patellar or Babinski's reflexes.  Gait Not antalgic or ataxic  Vascular Normal pulses. No carotid or arterial bruits. No varicose veins.    Does not meet standards. Certification Status: does meet standards for 2 year certificate.  Certification expires 10/11/2021

## 2019-10-24 ENCOUNTER — Encounter: Payer: Self-pay | Admitting: Family Medicine

## 2019-10-24 DIAGNOSIS — E559 Vitamin D deficiency, unspecified: Secondary | ICD-10-CM

## 2019-10-24 MED ORDER — VITAMIN D (ERGOCALCIFEROL) 1.25 MG (50000 UNIT) PO CAPS: 50000.0000 [IU] | ORAL_CAPSULE | ORAL | 0 refills | Status: DC

## 2020-05-21 ENCOUNTER — Encounter: Payer: Self-pay | Admitting: Registered Nurse

## 2020-05-22 NOTE — Telephone Encounter (Signed)
Patient has not been seen since august 2021. Will call patient today and try to schedule him an appointment.

## 2020-05-23 NOTE — Telephone Encounter (Signed)
Pt has been scheduled for an appt with Richard on 05/28/20

## 2020-05-28 ENCOUNTER — Ambulatory Visit: Payer: Managed Care, Other (non HMO) | Admitting: Registered Nurse

## 2020-05-30 ENCOUNTER — Other Ambulatory Visit: Payer: Self-pay

## 2020-05-30 ENCOUNTER — Ambulatory Visit (INDEPENDENT_AMBULATORY_CARE_PROVIDER_SITE_OTHER): Payer: Managed Care, Other (non HMO) | Admitting: Registered Nurse

## 2020-05-30 VITALS — BP 120/76 | HR 85 | Temp 98.0°F | Resp 17 | Ht 75.0 in | Wt 282.8 lb

## 2020-05-30 DIAGNOSIS — M25552 Pain in left hip: Secondary | ICD-10-CM

## 2020-05-30 DIAGNOSIS — M5441 Lumbago with sciatica, right side: Secondary | ICD-10-CM | POA: Diagnosis not present

## 2020-05-30 DIAGNOSIS — G8929 Other chronic pain: Secondary | ICD-10-CM

## 2020-05-30 DIAGNOSIS — M25551 Pain in right hip: Secondary | ICD-10-CM | POA: Diagnosis not present

## 2020-06-03 ENCOUNTER — Encounter: Payer: Self-pay | Admitting: Registered Nurse

## 2020-06-03 NOTE — Progress Notes (Signed)
Established Patient Office Visit  Subjective:  Patient ID: Adam Alvarez, male    DOB: 30-Jun-1985  Age: 35 y.o. MRN: [id number]  CC:  Chief Complaint  Patient presents with  . Back Pain    Pt here to discuss recertifying for FMLA for lower back and hip issues, notes has had this before but term has ended     HPI Adam Alvarez presents for FMLA  Lower back and hips - chronic pain. Worse some days than others. Has tried conservative treatment, PT, and consult with orthopedics - no relief. Flares some days worse than others, but overall stable. No new or changing symptoms or frequency. Denies saddle anesthesia and radicular symptoms.   Otherwise feeling well, no concerns.   Past Medical History:  Diagnosis Date  . Back pain   . Glaucoma   . Hyperhydrosis disorder   . Hypertension   . Lactose intolerance   . Migraine     Past Surgical History:  Procedure Laterality Date  . JOINT REPLACEMENT    . KNEE SURGERY      Family History  Problem Relation Age of Onset  . High blood pressure Mother   . Obesity Mother     Social History   Socioeconomic History  . Marital status: Married    Spouse name: Racquel  . Number of children: Not on file  . Years of education: Not on file  . Highest education level: Not on file  Occupational History  . 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 Smoking status: Former Games developer  . Smokeless tobacco: Never Used  Substance and Sexual Activity  . Alcohol use: No  . Drug use: No  . Sexual activity: Not on file  Other Topics Concern  . Not on file  Social History Narrative  . Not on file   Social Determinants of Health   Financial Resource Strain: Not on file  Food Insecurity: Not on file  Transportation Needs: Not on file  Physical Activity: Not on file  Stress: Not on file  Social Connections: Not on file  Intimate Partner Violence: Not on file    Outpatient Medications Prior to Visit  Medication Sig  Dispense Refill  . glycopyrrolate (ROBINUL) 1 MG tablet Take 1 tablet (1 mg total) by mouth daily. 90 tablet 1  . oxybutynin (DITROPAN) 5 MG tablet TAKE 1 TABLET BY MOUTH THREE TIMES DAILY 90 tablet 2  . Vitamin D, Ergocalciferol, (DRISDOL) 1.25 MG (50000 UNIT) CAPS capsule Take 1 capsule (50,000 Units total) by mouth every 7 (seven) days. 15 capsule 0   No facility-administered medications prior to visit.    No Known Allergies  ROS Review of Systems  Constitutional: Negative.   HENT: Negative.   Eyes: Negative.   Respiratory: Negative.   Cardiovascular: Negative.   Gastrointestinal: Negative.   Genitourinary: Negative.   Musculoskeletal: Negative.   Skin: Negative.   Neurological: Negative.   Psychiatric/Behavioral: Negative.   All other systems reviewed and are negative.     Objective:    Physical Exam Constitutional:      General: He is not in acute distress.    Appearance: Normal appearance. He is normal weight. He is not ill-appearing, toxic-appearing or diaphoretic.  Cardiovascular:     Rate and Rhythm: Normal rate and regular rhythm.     Heart sounds: Normal heart sounds. No murmur heard. No friction rub. No gallop.   Pulmonary:     Effort: Pulmonary effort is normal.  No respiratory distress.     Breath sounds: Normal breath sounds. No stridor. No wheezing, rhonchi or rales.  Chest:     Chest wall: No tenderness.  Neurological:     General: No focal deficit present.     Mental Status: He is alert and oriented to person, place, and time. Mental status is at baseline.  Psychiatric:        Mood and Affect: Mood normal.        Behavior: Behavior normal.        Thought Content: Thought content normal.        Judgment: Judgment normal.     BP 120/76   Pulse 85   Temp 98 F (36.7 C) (Temporal)   Resp 17   Ht 6\' 3"  (1.905 m)   Wt 282 lb 12.8 oz (128.3 kg)   SpO2 97%   BMI 35.35 kg/m  Wt Readings from Last 3 Encounters:  05/30/20 282 lb 12.8 oz (128.3 kg)   10/12/19 288 lb (130.6 kg)  08/08/19 287 lb 3.2 oz (130.3 kg)     There are no preventive care reminders to display for this patient.  There are no preventive care reminders to display for this patient.  Lab Results  Component Value Date   TSH 1.450 04/07/2018   Lab Results  Component Value Date   WBC 3.7 (A) 04/07/2018   HGB 14.7 (A) 04/07/2018   HCT 43.6 (A) 04/07/2018   MCV 87.5 04/07/2018   PLT 217 04/11/2017   Lab Results  Component Value Date   NA 138 12/19/2018   K 4.8 12/19/2018   CO2 27 12/19/2018   GLUCOSE 93 12/19/2018   BUN 14 12/19/2018   CREATININE 1.43 (H) 12/19/2018   BILITOT 0.6 12/19/2018   ALKPHOS 61 12/19/2018   AST 21 12/19/2018   ALT 38 12/19/2018   PROT 7.0 12/19/2018   ALBUMIN 4.5 12/19/2018   CALCIUM 9.6 12/19/2018   ANIONGAP 9 04/11/2017   Lab Results  Component Value Date   CHOL 218 (H) 12/19/2018   Lab Results  Component Value Date   HDL 37 (L) 12/19/2018   Lab Results  Component Value Date   LDLCALC 134 (H) 12/19/2018   Lab Results  Component Value Date   TRIG 259 (H) 12/19/2018   No results found for: CHOLHDL Lab Results  Component Value Date   HGBA1C 5.4 12/19/2018      Assessment & Plan:   Problem List Items Addressed This Visit   None   Visit Diagnoses    Chronic midline low back pain with right-sided sciatica    -  Primary   Chronic hip pain, bilateral          No orders of the defined types were placed in this encounter.   Follow-up: No follow-ups on file.   PLAN  Continue conservative management  Continue FMLA restrictions - forms to be filled out and returned to employer  Patient encouraged to call clinic with any questions, comments, or concerns.  13/03/2018, NP

## 2020-06-07 ENCOUNTER — Telehealth: Payer: Self-pay | Admitting: Family Medicine

## 2020-06-07 NOTE — Telephone Encounter (Signed)
Patient called checking on his fmla paperwork - please advise

## 2020-06-07 NOTE — Telephone Encounter (Signed)
Do you have this paperwork?

## 2020-06-10 NOTE — Telephone Encounter (Signed)
You put it in the black bin to be faxed ?

## 2020-06-10 NOTE — Telephone Encounter (Signed)
I filled out and signed what we had - no other paperwork here as far as I can find.   Thanks  [employer]

## 2020-06-10 NOTE — Telephone Encounter (Signed)
Okay thank you

## 2020-06-10 NOTE — Telephone Encounter (Signed)
I believe so - I don't remember distinctly but I think it was one of the days that I finish early so I just sat out at the counter and did a few pieces of paperwork

## 2020-06-11 NOTE — Telephone Encounter (Signed)
Can you please call pt back, I didn't find any pw up front.

## 2020-06-13 NOTE — Telephone Encounter (Signed)
Called patient to let him know that the paperwork has been completed and upfront for pickup.

## 2020-08-10 ENCOUNTER — Encounter (HOSPITAL_BASED_OUTPATIENT_CLINIC_OR_DEPARTMENT_OTHER): Payer: Self-pay | Admitting: Emergency Medicine

## 2020-08-10 ENCOUNTER — Emergency Department (HOSPITAL_BASED_OUTPATIENT_CLINIC_OR_DEPARTMENT_OTHER)
Admission: EM | Admit: 2020-08-10 | Discharge: 2020-08-10 | Disposition: A | Discharge status: Home / Self Care | Payer: Managed Care, Other (non HMO) | Attending: Emergency Medicine | Admitting: Emergency Medicine

## 2020-08-10 ENCOUNTER — Other Ambulatory Visit: Payer: Self-pay

## 2020-08-10 DIAGNOSIS — Z87891 Personal history of nicotine dependence: Secondary | ICD-10-CM | POA: Insufficient documentation

## 2020-08-10 DIAGNOSIS — I10 Essential (primary) hypertension: Secondary | ICD-10-CM | POA: Diagnosis not present

## 2020-08-10 DIAGNOSIS — K0889 Other specified disorders of teeth and supporting structures: Secondary | ICD-10-CM | POA: Diagnosis present

## 2020-08-10 DIAGNOSIS — Z9660 Presence of unspecified orthopedic joint implant: Secondary | ICD-10-CM | POA: Diagnosis not present

## 2020-08-10 MED ORDER — OXYCODONE-ACETAMINOPHEN 5-325 MG PO TABS: 1.0000 | ORAL_TABLET | Freq: Four times a day (QID) | ORAL | 0 refills | Status: DC | PRN

## 2020-08-10 MED ORDER — AMOXICILLIN 500 MG PO CAPS
500.0000 mg | ORAL_CAPSULE | Freq: Once | ORAL | Status: AC
  Administered 2020-08-10: 500 mg via ORAL
  Filled 2020-08-10: qty 1

## 2020-08-10 MED ORDER — AMOXICILLIN 500 MG PO CAPS: 500.0000 mg | ORAL_CAPSULE | Freq: Three times a day (TID) | ORAL | 0 refills | Status: DC

## 2020-08-10 MED ORDER — BUPIVACAINE-EPINEPHRINE (PF) 0.5% -1:200000 IJ SOLN
1.8000 mL | Freq: Once | INTRAMUSCULAR | Status: AC
  Administered 2020-08-10: 1.8 mL

## 2020-08-10 NOTE — ED Provider Notes (Signed)
[facility] Provider Note: [name], MD, FACEP  CSN: [id number] MRN: [id number] ARRIVAL: 08/10/20 at 0129 ROOM: [facility]   CHIEF COMPLAINT  Jaw Pain   HISTORY OF PRESENT ILLNESS  08/10/20 3:27 AM Adam Alvarez is a 35 y.o. male who had an endodontic procedure done about 3 weeks ago on his right lower first molar.  He was supposed to have a crown placed yesterday but in the process of drilling the tooth he states the drill broke off into his jaw.  A temporary filling was placed.  He has subsequently developed severe pain (10 out of 10) radiating from that tooth to the right side of his face.  It is not worse with palpation of the soft tissue of the face or gum.   Past Medical History:  Diagnosis Date   Back pain    Glaucoma    Hyperhydrosis disorder    Hypertension    Lactose intolerance    Migraine     Past Surgical History:  Procedure Laterality Date   JOINT REPLACEMENT     KNEE SURGERY      Family History  Problem Relation Age of Onset   High blood pressure Mother    Obesity Mother     Social History   Tobacco Use   Smoking status: Former    Pack years: 0.00   Smokeless tobacco: Never  Substance Use Topics   Alcohol use: No   Drug use: No    Prior to Admission medications   Medication Sig Start Date End Date Taking? Authorizing Provider  amoxicillin (AMOXIL) 500 MG capsule Take 1 capsule (500 mg total) by mouth 3 (three) times daily. 08/10/20  Yes Miia Blanks, MD  oxyCODONE-acetaminophen (PERCOCET) 5-325 MG tablet Take 1-2 tablets by mouth every 6 (six) hours as needed for severe pain. 08/10/20  Yes Dorrie Cocuzza, MD  glycopyrrolate (ROBINUL) 1 MG tablet Take 1 tablet (1 mg total) by mouth daily. 08/08/19   [name], NP  oxybutynin (DITROPAN) 5 MG tablet TAKE 1 TABLET BY MOUTH THREE TIMES DAILY 10/01/19   Shade Flood, MD  Vitamin D, Ergocalciferol, (DRISDOL) 1.25 MG (50000 UNIT) CAPS capsule Take 1 capsule (50,000 Units total)  by mouth every 7 (seven) days. 10/24/19   [name], NP    Allergies Patient has no known allergies.   REVIEW OF SYSTEMS  Negative except as noted here or in the History of Present Illness.   PHYSICAL EXAMINATION  Initial Vital Signs Blood pressure (!) 125/110, pulse 93, temperature 98.1 F (36.7 C), temperature source Oral, resp. rate 18, height 6\' 3"  (1.905 m), weight 127 kg, SpO2 96 %.  Examination General: Well-developed, well-nourished male in no acute distress; appearance consistent with age of record HENT: normocephalic; atraumatic; right lower first molar with temporary filling present, no swelling or tenderness of adjacent soft tissue Eyes: Normal appearance Neck: supple; no lymphadenopathy Heart: regular rate and rhythm Lungs: clear to auscultation bilaterally Abdomen: soft; nondistended; nontender; bowel sounds present Extremities: No deformity; full range of motion Neurologic: Awake, alert and oriented; motor function intact in all extremities and symmetric; no facial droop Skin: Warm and dry Psychiatric: Normal mood and affect   RESULTS  Summary of this visit's results, reviewed and interpreted by myself:   EKG Interpretation  Date/Time:    Ventricular Rate:    PR Interval:    QRS Duration:   QT Interval:    QTC Calculation:   R Axis:     Text  Interpretation:          Laboratory Studies: No results found for this or any previous visit (from the past 24 hour(s)). Imaging Studies: No results found.  ED COURSE and MDM  Nursing notes, initial and subsequent vitals signs, including pulse oximetry, reviewed and interpreted by myself.  Vitals:   08/10/20 0153 08/10/20 0156  BP:  (!) 125/110  Pulse:  93  Resp:  18  Temp:  98.1 F (36.7 C)  TempSrc:  Oral  SpO2:  96%  Weight: 127 kg   Height: 6\' 3"  (1.905 m)    Medications  amoxicillin (AMOXIL) capsule 500 mg (has no administration in time range)  bupivacaine-epinephrine (MARCAINE W/ EPI)  0.5% -1:200000 injection 1.8 mL (1.8 mLs Infiltration Given 08/10/20 0337)    We will start the patient on amoxicillin in case this represents a nascent odontogenic infection.  We will give him a short course of analgesics pending follow-up with his dentist  PROCEDURES  Procedures DENTAL BLOCK 1.8 milliliters of 0.5% bupivacaine with epinephrine were injected into the buccal fold adjacent to the right lower first molar. The patient tolerated this well and there were no immediate complications. Adequate analgesia was obtained.   ED DIAGNOSES     ICD-10-CM   1. Pain, dental  K08.89          08/12/20, MD 08/10/20 [phone]

## 2020-08-10 NOTE — ED Triage Notes (Signed)
Had dental work done 1pm 6/24 new onset lower right jaw pain that radiates up to side of head.

## 2020-12-04 IMAGING — CR DG LUMBAR SPINE COMPLETE 4+V
5 series · 5 of 5 positions shown · non-contrast
Comparison: 03/30/2012

CLINICAL DATA: Low back pain

EXAM:
LUMBAR SPINE - COMPLETE 4+ VIEW

[t lumbar spine ap]
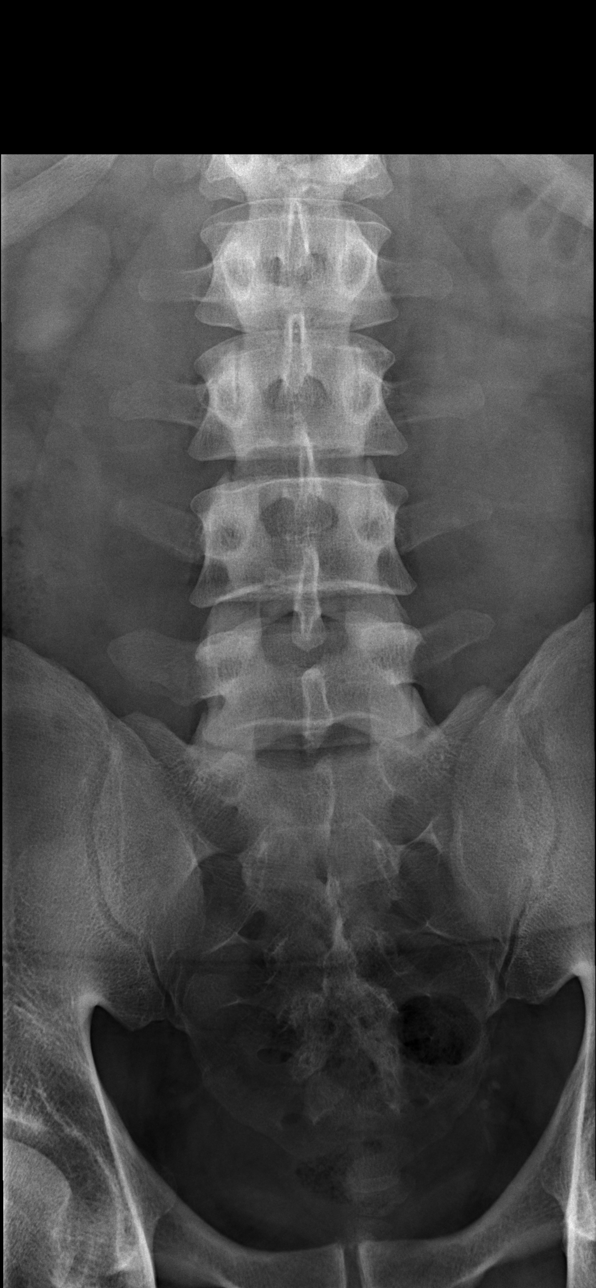

[t lumbar spine obl (1 of 2)]
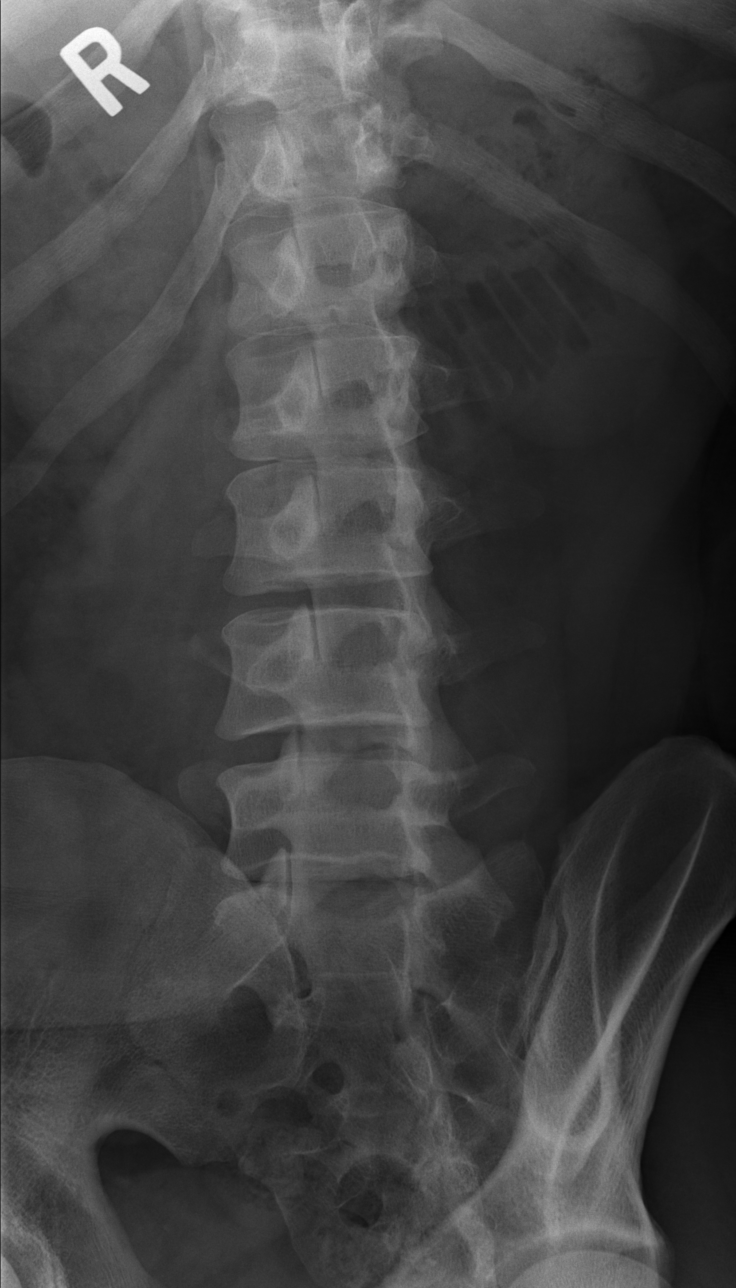

[t lumbar spine obl (2 of 2)]
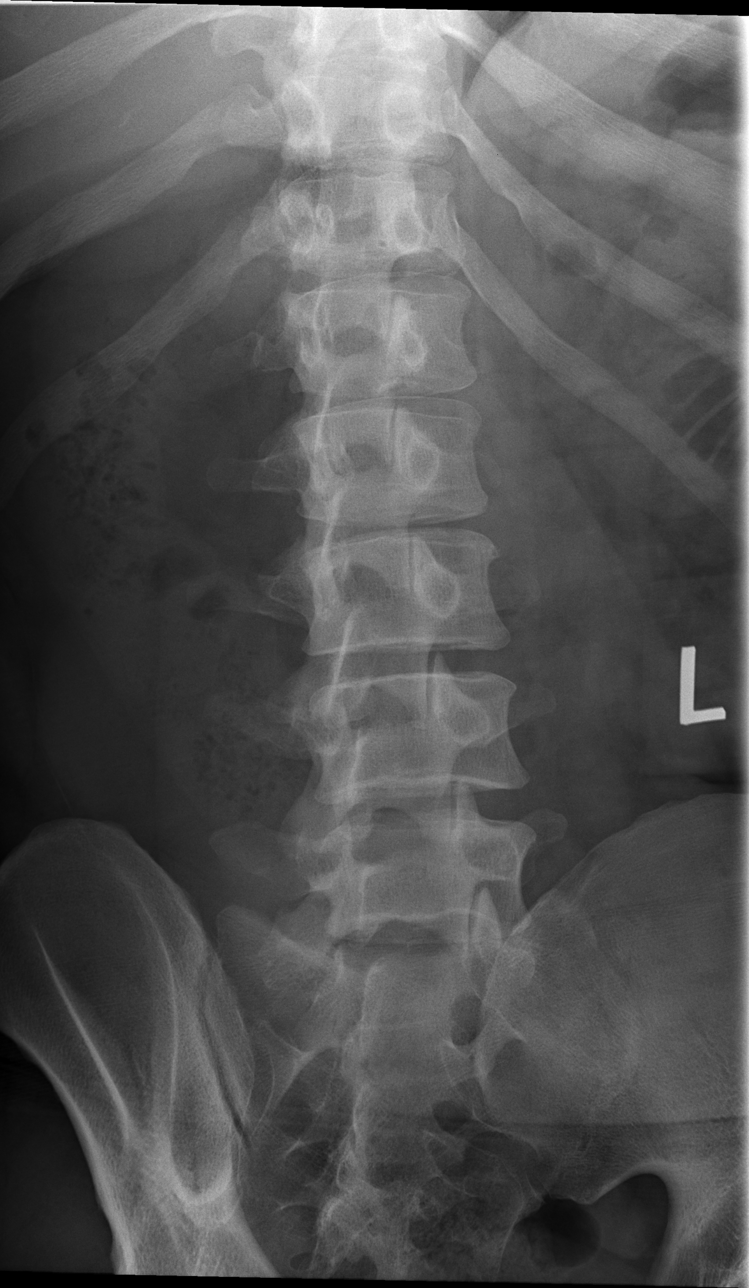

[t lumbar spine lat]
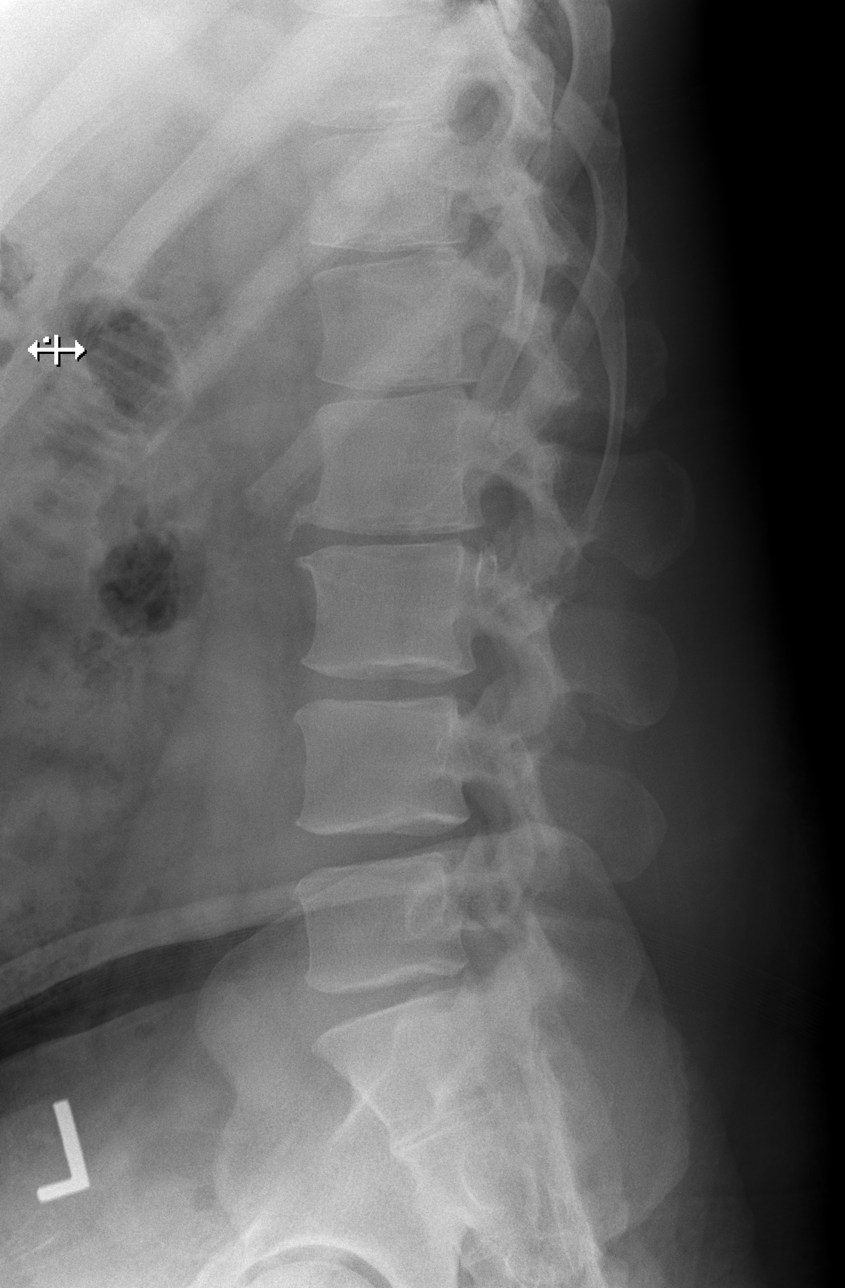

[t lumbar l-5 s-1 spot]
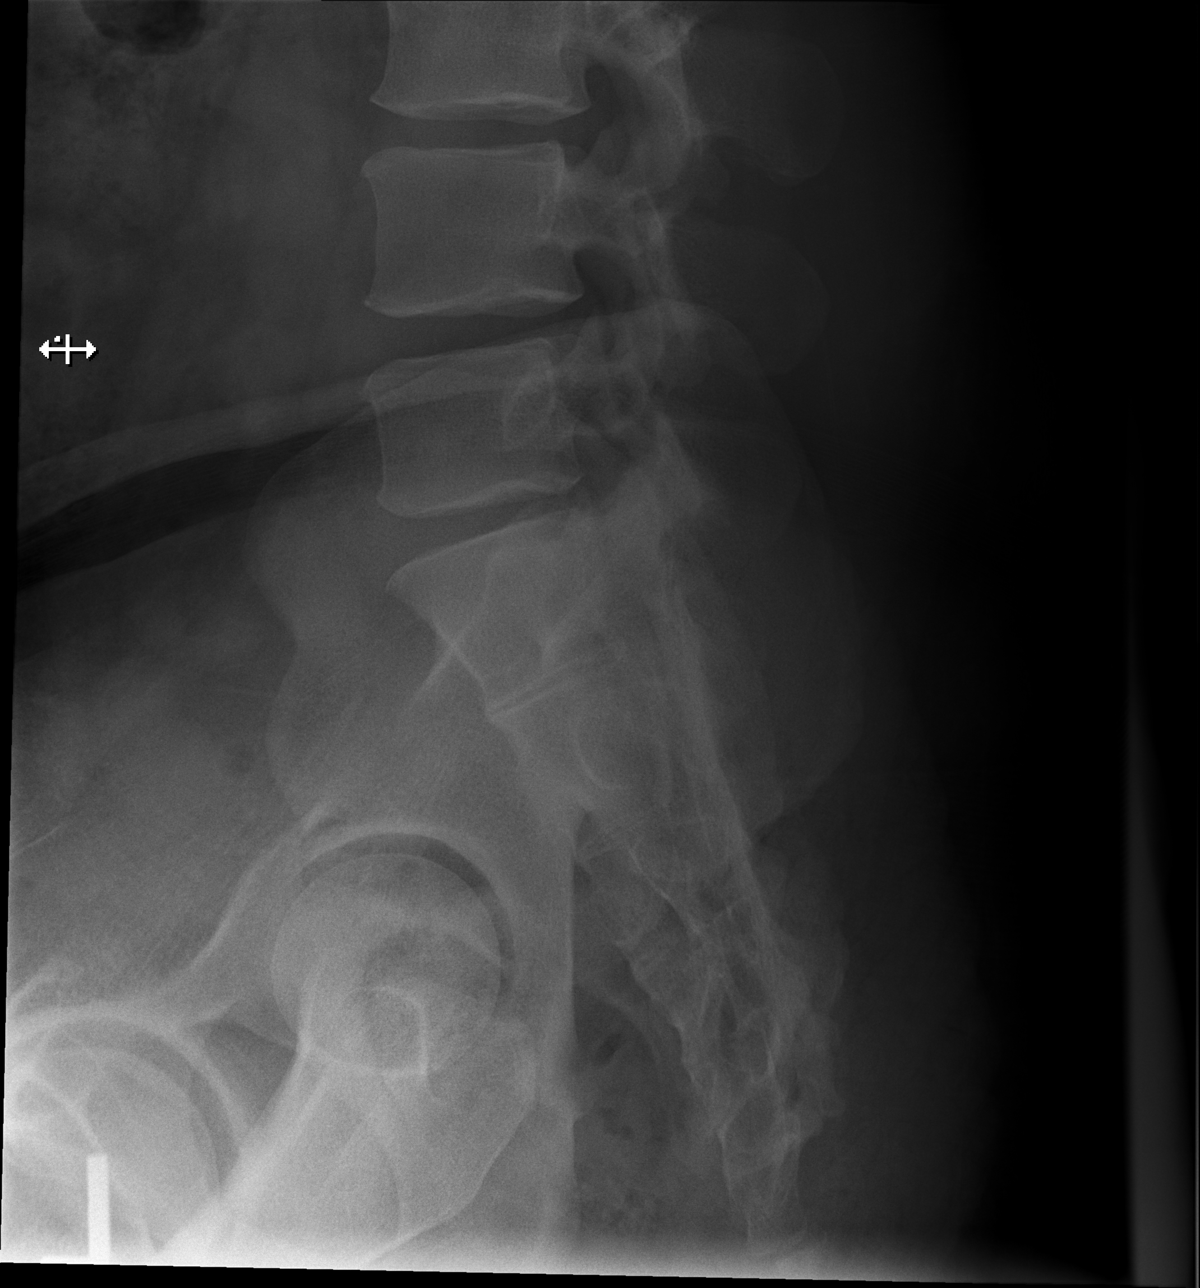

[5 of 5 positions shown; findings below may reference images not displayed]

FINDINGS: Lumbar alignment is normal. Vertebral body heights are maintained.
Mild degenerative changes at L2-L3 and L5-S1.
IMPRESSION: Mild degenerative changes.  No acute osseous abnormality

## 2021-04-29 ENCOUNTER — Telehealth (INDEPENDENT_AMBULATORY_CARE_PROVIDER_SITE_OTHER): Payer: Managed Care, Other (non HMO) | Admitting: Registered Nurse

## 2021-04-29 ENCOUNTER — Encounter: Payer: Self-pay | Admitting: Registered Nurse

## 2021-04-29 DIAGNOSIS — L749 Eccrine sweat disorder, unspecified: Secondary | ICD-10-CM

## 2021-04-29 DIAGNOSIS — J302 Other seasonal allergic rhinitis: Secondary | ICD-10-CM

## 2021-04-29 DIAGNOSIS — E559 Vitamin D deficiency, unspecified: Secondary | ICD-10-CM

## 2021-04-29 MED ORDER — OLOPATADINE HCL 0.1 % OP SOLN: 1.0000 [drp] | Freq: Two times a day (BID) | OPHTHALMIC | 12 refills | Status: AC

## 2021-04-29 MED ORDER — AZELASTINE HCL 0.1 % NA SOLN: 1.0000 | Freq: Two times a day (BID) | NASAL | 12 refills | Status: DC

## 2021-04-29 MED ORDER — VITAMIN D (ERGOCALCIFEROL) 1.25 MG (50000 UNIT) PO CAPS: 50000.0000 [IU] | ORAL_CAPSULE | ORAL | 0 refills | Status: AC

## 2021-04-29 MED ORDER — CETIRIZINE HCL 10 MG PO TABS: 10.0000 mg | ORAL_TABLET | Freq: Every day | ORAL | 3 refills | Status: AC

## 2021-04-29 MED ORDER — MONTELUKAST SODIUM 10 MG PO TABS: 10.0000 mg | ORAL_TABLET | Freq: Every day | ORAL | 3 refills | Status: AC

## 2021-04-29 MED ORDER — OXYBUTYNIN CHLORIDE 5 MG PO TABS: 5.0000 mg | ORAL_TABLET | Freq: Three times a day (TID) | ORAL | 3 refills | Status: AC

## 2021-04-29 NOTE — Patient Instructions (Signed)
° ° ° °  If you have lab work done today you will be contacted with your lab results within the next 2 weeks.  If you have not heard from us then please contact us. The fastest way to get your results is to register for My Chart. ° ° °IF you received an x-ray today, you will receive an invoice from [facility]. Please contact  Radiology at [phone] with questions or concerns regarding your invoice.  ° °IF you received labwork today, you will receive an invoice from [facility]. Please contact [facility] at [phone] with questions or concerns regarding your invoice.  ° °Our billing staff will not be able to assist you with questions regarding bills from these companies. ° °You will be contacted with the lab results as soon as they are available. The fastest way to get your results is to activate your My Chart account. Instructions are located on the last page of this paperwork. If you have not heard from us regarding the results in 2 weeks, please contact this office. °  ° ° ° °

## 2021-04-29 NOTE — Progress Notes (Signed)
? ? ?Telemedicine Encounter- SOAP NOTE Established Patient ? ?This video encounter was conducted with the patient's (or proxy's) verbal consent via audio telecommunications: yes/no: Yes ?Patient was instructed to have this encounter in a suitably private space; and to only have persons present to whom they give permission to participate. In addition, patient identity was confirmed by use of name plus two identifiers (DOB and address).  I discussed the limitations, risks, security and privacy concerns of performing an evaluation and management service by telephone and the availability of in person appointments. I also discussed with the patient that there may be a patient responsible charge related to this service. The patient expressed understanding and agreed to proceed. ? ?I spent a total of 12 minutes talking with the patient or their proxy. ? ?Patient at home ?Provider in office ? ?Participants: Adam Sportsman, NP and Gildardo Pounds Adams ? ?Chief Complaint  ?Patient presents with  ? Medication Refill  ?  Patient states he would like to discuss getting a medication refill and also a allergy medication   ? ? ?Subjective  ? ?Adam Alvarez is a 36 y.o. established patient. Telephone visit today for med refills.  ? ?HPI ?Hyperhidrosis: ?Oxybutinin 5mg  po tid ?Good effect ?AE of limited urination when taking this, some mild sedation ?Benefits outweigh AE. He still produces urine. ? ?Allergies: ?Has used OTC analgesics with good effect, however, cost is adding up. ?Would like to discuss Rx options ?Notes itching eyes, nasal congestion, rhinitis.  ?No wheezing or difficulty breathing. ? ?Patient Active Problem List  ? Diagnosis Date Noted  ? Dyslipidemia 12/20/2018  ? Vitamin D deficiency 12/20/2018  ? Hyperhidrosis 12/17/2017  ? ? ?Past Medical History:  ?Diagnosis Date  ? Back pain   ? Glaucoma   ? Hyperhydrosis disorder   ? Hypertension   ? Lactose intolerance   ? Migraine   ? ? ?Current Outpatient Medications   ?Medication Sig Dispense Refill  ? azelastine (ASTELIN) 0.1 % nasal spray Place 1 spray into both nostrils 2 (two) times daily. Use in each nostril as directed 30 mL 12  ? cetirizine (ZYRTEC) 10 MG tablet Take 1 tablet (10 mg total) by mouth daily. 90 tablet 3  ? montelukast (SINGULAIR) 10 MG tablet Take 1 tablet (10 mg total) by mouth at bedtime. 90 tablet 3  ? olopatadine (PATANOL) 0.1 % ophthalmic solution Place 1 drop into both eyes 2 (two) times daily. 5 mL 12  ? oxybutynin (DITROPAN) 5 MG tablet Take 1 tablet (5 mg total) by mouth 3 (three) times daily. 270 tablet 3  ? Vitamin D, Ergocalciferol, (DRISDOL) 1.25 MG (50000 UNIT) CAPS capsule Take 1 capsule (50,000 Units total) by mouth every 7 (seven) days. 15 capsule 0  ? ?No current facility-administered medications for this visit.  ? ? ?No Known Allergies ? ?Social History  ? ?Socioeconomic History  ? Marital status: Married  ?  Spouse name: Racquel  ? Number of children: Not on file  ? Years of education: Not on file  ? Highest education level: Not on file  ?Occupational History  ? 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 Smoking status: Former  ? Smokeless tobacco: Never  ?Substance and Sexual Activity  ? Alcohol use: No  ? Drug use: No  ? Sexual activity: Not on file  ?Other Topics Concern  ? Not on file  ?Social History Narrative  ? Not on file  ? ?Social Determinants of Health  ? ?13/02/2017  Strain: Not on file  ?Food Insecurity: Not on file  ?Transportation Needs: Not on file  ?Physical Activity: Not on file  ?Stress: Not on file  ?Social Connections: Not on file  ?Intimate Partner Violence: Not on file  ? ? ?ROS ?Per hpi  ? ?Objective  ? ?Vitals as reported by the patient: ?There were no vitals filed for this visit. ? ?[place] was seen today for medication refill. ? ?Diagnoses and all orders for this visit: ? ?Seasonal allergies ?-     cetirizine (ZYRTEC) 10 MG tablet; Take 1 tablet (10 mg total) by mouth daily. ?-      olopatadine (PATANOL) 0.1 % ophthalmic solution; Place 1 drop into both eyes 2 (two) times daily. ?-     azelastine (ASTELIN) 0.1 % nasal spray; Place 1 spray into both nostrils 2 (two) times daily. Use in each nostril as directed ?-     montelukast (SINGULAIR) 10 MG tablet; Take 1 tablet (10 mg total) by mouth at bedtime. ? ?Sweating abnormality ?-     oxybutynin (DITROPAN) 5 MG tablet; Take 1 tablet (5 mg total) by mouth 3 (three) times daily. ? ?Vitamin D deficiency ?-     Vitamin D, Ergocalciferol, (DRISDOL) 1.25 MG (50000 UNIT) CAPS capsule; Take 1 capsule (50,000 Units total) by mouth every 7 (seven) days. ? ? ? ?PLAN ?Refill oxybutinin ?Can try some combination of cetirizine and/or azelastine nasal spray and/or patanol eye drops and/or montelukast. Reviewed risks, benefits, and alternatives to these medications with patient who voices understanding. ?Return for CPE and labs before end of year. ?Patient encouraged to call clinic with any questions, comments, or concerns. ? ?I discussed the assessment and treatment plan with the patient. The patient was provided an opportunity to ask questions and all were answered. The patient agreed with the plan and demonstrated an understanding of the instructions. ?  ?The patient was advised to call back or seek an in-person evaluation if the symptoms worsen or if the condition fails to improve as anticipated. ? ?I provided 12 minutes of face-to-face time during this encounter. ? ?[name], NP ?

## 2021-05-15 ENCOUNTER — Other Ambulatory Visit: Payer: Self-pay

## 2021-05-15 ENCOUNTER — Telehealth (INDEPENDENT_AMBULATORY_CARE_PROVIDER_SITE_OTHER): Payer: Managed Care, Other (non HMO) | Admitting: Registered Nurse

## 2021-05-15 ENCOUNTER — Encounter: Payer: Self-pay | Admitting: Registered Nurse

## 2021-05-15 DIAGNOSIS — G4726 Circadian rhythm sleep disorder, shift work type: Secondary | ICD-10-CM | POA: Diagnosis not present

## 2021-05-15 MED ORDER — TRAZODONE HCL 50 MG PO TABS: 25.0000 mg | ORAL_TABLET | Freq: Every evening | ORAL | 3 refills | Status: AC | PRN

## 2021-05-15 NOTE — Progress Notes (Signed)
? ? ?Telemedicine Encounter- SOAP NOTE Established Patient ? ?This telephone encounter was conducted with the patient's (or proxy's) verbal consent via audio telecommunications: yes/no: Yes ?Patient was instructed to have this encounter in a suitably private space; and to only have persons present to whom they give permission to participate. In addition, patient identity was confirmed by use of name plus two identifiers (DOB and address).  I discussed the limitations, risks, security and privacy concerns of performing an evaluation and management service by telephone and the availability of in person appointments. I also discussed with the patient that there may be a patient responsible charge related to this service. The patient expressed understanding and agreed to proceed. ? ?I spent a total of 13 minutes talking with the patient or their proxy. ? ?Patient at home ?Provider in office ? ?Participants: Adam Sportsman, NP and Adam Alvarez ? ?Chief Complaint  ?Patient presents with  ? Insomnia  ?  Patient states he has been having some insomnia and it seems to be getting worse. Patient states he is taking Benadryl and only getting one hour of sleep.  ? ? ?Subjective  ? ?Adam Alvarez is a 36 y.o. established patient. Telephone visit today for insomnia ? ?HPI ?Endorses good sleep hygiene. Does have occ coffee or 5 hour energy. He does work nights typically.  ?Goes to bed around 9am. Tries to keep room dark and cool. ?Has taken melatonin, benadryl. No relief. ? ?Patient Active Problem List  ? Diagnosis Date Noted  ? Dyslipidemia 12/20/2018  ? Vitamin D deficiency 12/20/2018  ? Hyperhidrosis 12/17/2017  ? ? ?Past Medical History:  ?Diagnosis Date  ? Back pain   ? Glaucoma   ? Hyperhydrosis disorder   ? Hypertension   ? Lactose intolerance   ? Migraine   ? ? ?Current Outpatient Medications  ?Medication Sig Dispense Refill  ? traZODone (DESYREL) 50 MG tablet Take 0.5-2 tablets (25-100 mg total) by mouth at bedtime as  needed for sleep. 30 tablet 3  ? azelastine (ASTELIN) 0.1 % nasal spray Place 1 spray into both nostrils 2 (two) times daily. Use in each nostril as directed 30 mL 12  ? cetirizine (ZYRTEC) 10 MG tablet Take 1 tablet (10 mg total) by mouth daily. 90 tablet 3  ? montelukast (SINGULAIR) 10 MG tablet Take 1 tablet (10 mg total) by mouth at bedtime. 90 tablet 3  ? olopatadine (PATANOL) 0.1 % ophthalmic solution Place 1 drop into both eyes 2 (two) times daily. 5 mL 12  ? oxybutynin (DITROPAN) 5 MG tablet Take 1 tablet (5 mg total) by mouth 3 (three) times daily. 270 tablet 3  ? Vitamin D, Ergocalciferol, (DRISDOL) 1.25 MG (50000 UNIT) CAPS capsule Take 1 capsule (50,000 Units total) by mouth every 7 (seven) days. 15 capsule 0  ? ?No current facility-administered medications for this visit.  ? ? ?Not on File ? ?Social History  ? ?Socioeconomic History  ? Marital status: Married  ?  Spouse name: Adam Alvarez  ? Number of children: Not on file  ? Years of education: Not on file  ? Highest education level: Not on file  ?Occupational History  ? 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 Smoking status: Former  ? Smokeless tobacco: Never  ?Substance and Sexual Activity  ? Alcohol use: No  ? Drug use: No  ? Sexual activity: Not on file  ?Other Topics Concern  ? Not on file  ?Social History Narrative  ? Not  on file  ? ?Social Determinants of Health  ? ?Financial Resource Strain: Not on file  ?Food Insecurity: Not on file  ?Transportation Needs: Not on file  ?Physical Activity: Not on file  ?Stress: Not on file  ?Social Connections: Not on file  ?Intimate Partner Violence: Not on file  ? ? ?ROS ?Per hpi  ? ?Objective  ? ?Vitals as reported by the patient: ?There were no vitals filed for this visit. ? ?Adam Alvarez was seen today for insomnia. ? ?Diagnoses and all orders for this visit: ? ?Shift work sleep disorder ?-     traZODone (DESYREL) 50 MG tablet; Take 0.5-2 tablets (25-100 mg total) by mouth at bedtime as needed for  sleep. ? ? ? ?PLAN ?Discussed options - will try trazodone 25-100mg  po qhs prn. Reviewed risks, benefits, and side effects, pt voices understanding. ?Will consider further options such as seroquel, qviviq, belsomra, etc if ineffective. ?Patient encouraged to call clinic with any questions, comments, or concerns. ? ?I discussed the assessment and treatment plan with the patient. The patient was provided an opportunity to ask questions and all were answered. The patient agreed with the plan and demonstrated an understanding of the instructions. ?  ?The patient was advised to call back or seek an in-person evaluation if the symptoms worsen or if the condition fails to improve as anticipated. ? ?I provided 13 minutes of non-face-to-face time during this encounter. ? ?[name], NP ? ?

## 2021-05-15 NOTE — Patient Instructions (Signed)
° ° ° °  If you have lab work done today you will be contacted with your lab results within the next 2 weeks.  If you have not heard from us then please contact us. The fastest way to get your results is to register for My Chart. ° ° °IF you received an x-ray today, you will receive an invoice from [facility]. Please contact [facility] at [phone] with questions or concerns regarding your invoice.  ° °IF you received labwork today, you will receive an invoice from [facility]. Please contact [facility] at [phone] with questions or concerns regarding your invoice.  ° °Our billing staff will not be able to assist you with questions regarding bills from these companies. ° °You will be contacted with the lab results as soon as they are available. The fastest way to get your results is to activate your My Chart account. Instructions are located on the last page of this paperwork. If you have not heard from us regarding the results in 2 weeks, please contact this office. °  ° ° ° °

## 2021-07-03 ENCOUNTER — Telehealth (INDEPENDENT_AMBULATORY_CARE_PROVIDER_SITE_OTHER): Payer: Managed Care, Other (non HMO) | Admitting: Family Medicine

## 2021-07-03 DIAGNOSIS — M545 Low back pain, unspecified: Secondary | ICD-10-CM | POA: Diagnosis not present

## 2021-07-03 DIAGNOSIS — R61 Generalized hyperhidrosis: Secondary | ICD-10-CM

## 2021-07-03 DIAGNOSIS — G4700 Insomnia, unspecified: Secondary | ICD-10-CM

## 2021-07-03 DIAGNOSIS — K5900 Constipation, unspecified: Secondary | ICD-10-CM

## 2021-07-03 DIAGNOSIS — G8929 Other chronic pain: Secondary | ICD-10-CM

## 2021-07-03 DIAGNOSIS — R635 Abnormal weight gain: Secondary | ICD-10-CM

## 2021-07-03 MED ORDER — DRYSOL 20 % EX SOLN: Freq: Every day | CUTANEOUS | 1 refills | Status: AC

## 2021-07-03 NOTE — Patient Instructions (Addendum)
I will refer you to sleep specialist. Do not drive if you are sleepy.  Decrease oxybutynin to see if that is worsening constipation. Ideally would like to stop it temporarily. Miralax 1-2 times per day, and I will refer you to specialist.  Return to the clinic or go to the nearest emergency room if any of your symptoms worsen or new symptoms occur.  Tylenol if needed, short term methocarbamol, but be seen if that is not improving in next week.  Try topical drysol in place of oxybutynin, and please have lab test for thyroid when possible:  [facility] 8:30-4:30 during weekdays, no appointment needed [address].  [place], [place] [address]  Recheck in office next few weeks.  Return to the clinic or go to the nearest emergency room if any of your symptoms worsen or new symptoms occur.     Constipation, Adult Constipation is when a person has fewer than three bowel movements in a week, has difficulty having a bowel movement, or has stools (feces) that are dry, hard, or larger than normal. Constipation may be caused by an underlying condition. It may become worse with age if a person takes certain medicines and does not take in enough fluids. Follow these instructions at home: Eating and drinking  Eat foods that have a lot of fiber, such as beans, whole grains, and fresh fruits and vegetables. Limit foods that are low in fiber and high in fat and processed sugars, such as fried or sweet foods. These include french fries, hamburgers, cookies, candies, and soda. Drink enough fluid to keep your urine pale yellow. General instructions Exercise regularly or as told by your health care provider. Try to do 150 minutes of moderate exercise each week. Use the bathroom when you have the urge to go. Do not hold it in. Take over-the-counter and prescription medicines only as told by your health care provider. This includes any fiber supplements. During bowel movements: Practice deep  breathing while relaxing the lower abdomen. Practice pelvic floor relaxation. Watch your condition for any changes. Let your health care provider know about them. Keep all follow-up visits as told by your health care provider. This is important. Contact a health care provider if: You have pain that gets worse. You have a fever. You do not have a bowel movement after 4 days. You vomit. You are not hungry or you lose weight. You are bleeding from the opening between the buttocks (anus). You have thin, pencil-like stools. Get help right away if: You have a fever and your symptoms suddenly get worse. You leak stool or have blood in your stool. Your abdomen is bloated. You have severe pain in your abdomen. You feel dizzy or you faint. Summary Constipation is when a person has fewer than three bowel movements in a week, has difficulty having a bowel movement, or has stools (feces) that are dry, hard, or larger than normal. Eat foods that have a lot of fiber, such as beans, whole grains, and fresh fruits and vegetables. Drink enough fluid to keep your urine pale yellow. Take over-the-counter and prescription medicines only as told by your health care provider. This includes any fiber supplements. This information is not intended to replace advice given to you by your health care provider. Make sure you discuss any questions you have with your health care provider. Document Revised: 12/21/2018 Document Reviewed: 12/21/2018 Elsevier Patient Education  2023 [employer].

## 2021-07-03 NOTE — Progress Notes (Signed)
Virtual Visit via Video Note  I connected with Adam Alvarez on 07/03/21 at 4:08 PM by a video enabled telemedicine application and verified that I am speaking with the correct person using two identifiers.  Patient location: in car - by self  My location: office - [place].    I discussed the limitations, risks, security and privacy concerns of performing an evaluation and management service by telephone and the availability of in person appointments. I also discussed with the patient that there may be a patient responsible charge related to this service. The patient expressed understanding and agreed to proceed, consent obtained  Chief complaint:  Chief Complaint  Patient presents with   Insomnia    Pt reports has been doing better with trazodone treatment, notes he had doubled dose of trazodone for better relief    Back Pain    Pt reports back pain aver the last several months notes lower back pain, thinks this is contributing to constipation    Constipation    Pt notes takes fiber and difrent supplements. Notes this has been long standing issue wanted to know wht he can take to help support healthier bowel moments     History of Present Illness: Adam Alvarez is a 36 y.o. male  Insomnia Started on trazodone at recent visit with Janeece Ageeichard Morrow on 05/15/2021.  Shiftwork sleep disorder.  Option of 25 mg to 100 mg.  Currently taking 100mg  - working ok for sleep. Sleeping 3 hours with this dose, prior sleeping only sleeping 2 hours here and there. Naps during the day. No snoring that he knows of. No PND/choking waking up to urinate. Nocturia once per night.  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 No daytime sleepiness  Interrupted sleep with 35yo dtr at home during the day, less now in daycare.  He works nights.   Low back pain Chronic low back pain, hip pain discussed with my colleague in 2022.  Based on that note he had tried conservative  treatment, PT and consult with orthopedics without significant relief.  Episodic flares.  Family restrictions at that time.  Lumbar spine imaging reviewed from 06/29/2019, mild degenerative changes at L2-3 and L5-S1 without acute osseous abnormality. Back pain was improving until recently. More in past week with lower abd pain, thought to be due to constipation - similar in past that improved with constipation treatment.  No bowel or bladder incontinence, no saddle anesthesia, no lower extremity weakness. No fever, wt loss. No injury.  Tx: robaxin - past week.    Chronic constipation History of constipation for years. Worse lately. Some lower back and lower abd pain at times past week. Last BM 2 days ago, then 3 days prior.  Castor oil recently. Using mag citrate 2-3 times per week. Miralax did not work with BID dosing.  No GI eval, referred to GI but did not go.  No blood in stool.  Taking oxybutynin 5mg  tid for hyperhydrosis.    Patient Active Problem List   Diagnosis Date Noted   Dyslipidemia 12/20/2018   Vitamin D deficiency 12/20/2018   Hyperhidrosis 12/17/2017   Past Medical History:  Diagnosis Date   Back pain    Glaucoma    Hyperhydrosis disorder    Hypertension    Lactose intolerance    Migraine    Past Surgical History:  Procedure Laterality Date   JOINT REPLACEMENT     KNEE SURGERY     No Known Allergies Prior to Admission medications  Medication Sig Start Date End Date Taking? Authorizing Provider  azelastine (ASTELIN) 0.1 % nasal spray Place 1 spray into both nostrils 2 (two) times daily. Use in each nostril as directed 04/29/21  Yes [name], NP  cetirizine (ZYRTEC) 10 MG tablet Take 1 tablet (10 mg total) by mouth daily. 04/29/21  Yes [name], NP  montelukast (SINGULAIR) 10 MG tablet Take 1 tablet (10 mg total) by mouth at bedtime. 04/29/21  Yes [name], NP  olopatadine (PATANOL) 0.1 % ophthalmic solution Place 1 drop into both eyes 2 (two)  times daily. 04/29/21  Yes [name], NP  oxybutynin (DITROPAN) 5 MG tablet Take 1 tablet (5 mg total) by mouth 3 (three) times daily. 04/29/21  Yes [name], NP  traZODone (DESYREL) 50 MG tablet Take 0.5-2 tablets (25-100 mg total) by mouth at bedtime as needed for sleep. 05/15/21  Yes [name], NP  Vitamin D, Ergocalciferol, (DRISDOL) 1.25 MG (50000 UNIT) CAPS capsule Take 1 capsule (50,000 Units total) by mouth every 7 (seven) days. 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 Nontoxic appearance on video, normal respiratory effort, appropriate responses.  Euthymic mood.  All questions answered with understanding plan expressed  Assessment and Plan: Insomnia, unspecified type - Plan: Ambulatory referral to Sleep Studies  -Improved with trazodone but still only 3 hours of sleep at a time.  Fragmented sleep may be due to shift work.  However with him driving commercial vehicle I think it may be best to have sleep specialist evaluate to see if other testing needed.  Cautioned to not drive if any sleepiness.  Continue trazodone for now.  Constipation,  unspecified constipation type - Plan: Ambulatory referral to Gastroenterology, TSH  Longstanding issues with recent worsening.  Stressed importance of gastroenterology follow-up.  Oxybutynin likely contributing/worsening symptoms.  Stop if possible for now and switch to topical treatment for craniofacial hyperhidrosis.  MiraLAX twice daily.  RTC precautions.  Craniofacial hyperhidrosis - Plan: aluminum chloride (DRYSOL) 20 % external solution   -Advised to stop oxybutynin due to possibly worsening constipation, return to topical treatment with Drysol or over-the-counter prescription antiperspirant if cost prohibitive.  Chronic low back pain, unspecified back pain laterality, unspecified whether sciatica present  -No red flags on history.  Reports similar symptoms in the past with lower abdominal and lower back discomfort when constipated.  Treatment for constipation as above, in office evaluation requested next few weeks with RTC/ER precautions if worse sooner.  Weight gain - Plan: TSH  -Difficulty losing weight with increased  fiber and exercise.  Check TSH given constipation as above, in office evaluation requested next few weeks.    spent during visit, including chart review, counseling and assimilation of information, exam, discussion of plan, and chart completion.   Follow Up Instructions:    I discussed the assessment and treatment plan with the patient. The patient was provided an opportunity to ask questions and all were answered. The patient agreed with the plan and demonstrated an understanding of the instructions.   The patient was advised to call back or seek an in-person evaluation if the symptoms worsen or if the condition fails to improve as anticipated.   Shade Flood, MD

## 2021-07-07 ENCOUNTER — Other Ambulatory Visit (INDEPENDENT_AMBULATORY_CARE_PROVIDER_SITE_OTHER): Payer: Managed Care, Other (non HMO)

## 2021-07-07 DIAGNOSIS — R635 Abnormal weight gain: Secondary | ICD-10-CM | POA: Diagnosis not present

## 2021-07-07 DIAGNOSIS — K5900 Constipation, unspecified: Secondary | ICD-10-CM | POA: Diagnosis not present

## 2021-07-07 LAB — TSH: TSH: 0.79 u[IU]/mL (ref 0.35–5.50)

## 2021-09-24 ENCOUNTER — Encounter (INDEPENDENT_AMBULATORY_CARE_PROVIDER_SITE_OTHER): Payer: Self-pay

## 2021-10-15 ENCOUNTER — Ambulatory Visit (INDEPENDENT_AMBULATORY_CARE_PROVIDER_SITE_OTHER): Payer: Managed Care, Other (non HMO) | Admitting: Family Medicine

## 2021-10-15 ENCOUNTER — Encounter: Payer: Self-pay | Admitting: Family Medicine

## 2021-10-15 VITALS — BP 138/78 | HR 93 | Temp 97.7°F | Wt 279.0 lb

## 2021-10-15 DIAGNOSIS — R7989 Other specified abnormal findings of blood chemistry: Secondary | ICD-10-CM | POA: Diagnosis not present

## 2021-10-15 DIAGNOSIS — Z13220 Encounter for screening for lipoid disorders: Secondary | ICD-10-CM

## 2021-10-15 DIAGNOSIS — R030 Elevated blood-pressure reading, without diagnosis of hypertension: Secondary | ICD-10-CM

## 2021-10-15 MED ORDER — AMLODIPINE BESYLATE 2.5 MG PO TABS: 2.5000 mg | ORAL_TABLET | Freq: Every day | ORAL | 1 refills | Status: DC

## 2021-10-15 NOTE — Patient Instructions (Addendum)
Fasting labs at [facility] in next few days.  If blood pressure over 130/80 at home - start amlodipine once per day.  If any difficulty with sleep, or persistent nighttime wakening that recommend following up with sleep specialist.  Do not drive if you are sleepy or sedated.  Recheck 1 month.   [facility] in 8:30-4:30 during weekdays, no appointment needed [address]  [place], [place] [address]

## 2021-10-15 NOTE — Progress Notes (Signed)
Subjective:  Patient ID: Adam Alvarez, male    DOB: Oct 25, 1985  Age: 36 y.o. 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 2 readings, initial reading 151? No snoring. Nocturia 1-2 times per night, but increased fluids during day - 1-2 gallons per day. Fragmented sleep discussed in May, referred to sleep specialist. Did not see specialist.  Stopped trazodone few weeks ago. Helped to get to sleep, but still waking up at night at times to urinate as above. No dyspnea. No hx of OSA. 5hrs sleep per night - overnight work shift..  Denies daytime somnolence (no sleepy with driving). Not taking trazodone. Eye massager working well to get to sleep. No other sleep meds.  Constitutional: Negative for fatigue and unexpected weight change.  Eyes: Negative for visual disturbance.  Respiratory: Negative for cough, chest tightness and shortness of breath.   Cardiovascular: Negative for chest pain, palpitations and leg swelling.  Gastrointestinal: Negative for abdominal pain and blood in stool.  Neurological: Negative for dizziness, light-headedness and headaches.  Home readings: usually 128/85 range.  BP Readings from Last 3 Encounters:  10/15/21 138/78  08/10/20 (!) 125/110  05/30/20 120/76   Lab Results  Component Value Date   CREATININE 1.43 (H) 12/19/2018   Lab Results  Component Value Date   TSH 0.79 07/07/2021       History Patient Active Problem List   Diagnosis Date Noted   Dyslipidemia 12/20/2018   Vitamin D deficiency 12/20/2018   Hyperhidrosis 12/17/2017   Past Medical History:  Diagnosis Date   Back pain    Glaucoma    Hyperhydrosis disorder    Hypertension    Lactose  intolerance    Migraine    Past Surgical History:  Procedure Laterality Date   JOINT REPLACEMENT     KNEE SURGERY     No Known Allergies Prior to Admission medications   Medication Sig Start Date End Date Taking? Authorizing Provider  aluminum chloride (DRYSOL) 20 % external solution Apply topically at bedtime. To scalp, areas of hyperhidrosis. Avoid eyes, nose, mouth. 07/03/21  Yes Shade Flood, MD  azelastine (ASTELIN) 0.1 % nasal spray Place 1 spray into both nostrils 2 (two) times daily. Use in each nostril as directed 04/29/21  Yes [name], NP  cetirizine (ZYRTEC) 10 MG tablet Take 1 tablet (10 mg total) by mouth daily. 04/29/21  Yes [name], NP  montelukast (SINGULAIR) 10 MG tablet Take 1 tablet (10 mg total) by mouth at bedtime. 04/29/21  Yes [name], NP  oxybutynin (DITROPAN) 5 MG tablet Take 1 tablet (5 mg total) by mouth 3 (three) times daily. 04/29/21  Yes [name], NP  traZODone (DESYREL) 50 MG tablet Take 0.5-2 tablets (25-100 mg total) by mouth at bedtime as needed for sleep. 05/15/21  Yes [name], NP  Vitamin D, Ergocalciferol, (DRISDOL) 1.25 MG (50000 UNIT) CAPS capsule Take 1 capsule (50,000 Units total) by mouth every 7 (seven) days. 04/29/21  Yes [name], NP  olopatadine (PATANOL) 0.1 % ophthalmic solution Place 1 drop into both eyes 2 (two) times daily. 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  Tobacco Use   Smoking status: Former   Smokeless tobacco: Never  Substance and Sexual Activity   Alcohol use: No   Drug use: No   Sexual activity: Not on file  Other Topics Concern   Not on file  Social History Narrative   Not on file    Social Determinants of Health   Financial Resource Strain: Not on file  Food Insecurity: Not on file  Transportation Needs: Not on file  Physical Activity: Not on file  Stress: Not on file  Social Connections: Not on file  Intimate Partner Violence: Not on file    Review of Systems Per HPI  Objective:   Vitals:   10/15/21 1642 10/15/21 1656  BP: (!) 142/78 138/78  Pulse: 93   Temp: 97.7 F (36.5 C)   SpO2: 98%   Weight: 279 lb (126.6 kg)      Physical Exam Vitals reviewed.  Constitutional:      Appearance: He is well-developed.  HENT:     Head: Normocephalic and atraumatic.  Neck:     Vascular: No carotid bruit or JVD.  Cardiovascular:     Rate and Rhythm: Normal rate and regular rhythm.     Heart sounds: Normal heart sounds. No murmur heard. Pulmonary:     Effort: Pulmonary effort is normal.     Breath sounds: Normal breath sounds. No rales.  Musculoskeletal:     Right lower leg: No edema.     Left lower leg: No edema.  Skin:    General: Skin is warm and dry.  Neurological:     Mental Status: He is alert and oriented to person, place, and time.  Psychiatric:        Mood and Affect: Mood normal.     Assessment & Plan:  Adam Alvarez is a 36 y.o. male . Elevated blood pressure reading - Plan: amLODipine (NORVASC) 2.5 MG tablet, Comprehensive metabolic panel  Elevated serum creatinine - Plan: Comprehensive metabolic panel  Screening for hyperlipidemia - Plan: Comprehensive metabolic panel, Lipid panel  Elevated blood pressure reading outside of office and stage I hypertension based on in office readings.  No recent labs but slight elevated creatinine few years ago.  Asymptomatic.  -Monitor outside of office and if blood pressure above 130/80 start amlodipine 2.5 mg daily, potential side effects and risk discussed.  -Check fasting labs in the next few days.  -Denies snoring or daytime somnolence but discussed sleep specialist follow-up if frequent  wakening.  Advised to not drive if somnolent  -Not taking sleep medication at this time.  Letter provided for CMV examiner.   Meds ordered this encounter  Medications   amLODipine (NORVASC) 2.5 MG tablet    Sig: Take 1 tablet (2.5 mg total) by mouth daily.    Dispense:  30 tablet    Refill:  1   Patient Instructions  Fasting labs at [facility] in next few days.  If blood pressure over 130/80 at home - start amlodipine once per day.  If any difficulty with sleep, or persistent nighttime wakening that recommend following up with sleep specialist.  Do not drive if you are sleepy  or sedated.  Recheck 1 month.   [facility] in 8:30-4:30 during weekdays, no appointment needed [address]  [place], [place] [address]       Signed,   Meredith Staggers, MD [facility], [facility] 10/15/21 10:08 PM

## 2021-10-16 ENCOUNTER — Telehealth: Payer: Self-pay

## 2021-10-16 NOTE — Telephone Encounter (Signed)
Needs a letter stating he is not taking trazodone, and that he is ok to drive. Was seen yesterday and called this morning stating that what was filled out and given to him yesterday was not clear enough. The letter can be emailed to the email listed below.   [email or web address]

## 2022-01-02 ENCOUNTER — Encounter: Payer: Self-pay | Admitting: Family Medicine

## 2022-01-02 ENCOUNTER — Ambulatory Visit: Payer: Managed Care, Other (non HMO) | Admitting: Family Medicine

## 2022-01-02 VITALS — BP 140/98 | HR 90 | Temp 98.5°F | Ht 75.0 in | Wt 285.2 lb

## 2022-01-02 DIAGNOSIS — R030 Elevated blood-pressure reading, without diagnosis of hypertension: Secondary | ICD-10-CM | POA: Diagnosis not present

## 2022-01-02 DIAGNOSIS — Z13220 Encounter for screening for lipoid disorders: Secondary | ICD-10-CM

## 2022-01-02 DIAGNOSIS — R7989 Other specified abnormal findings of blood chemistry: Secondary | ICD-10-CM | POA: Diagnosis not present

## 2022-01-02 DIAGNOSIS — I10 Essential (primary) hypertension: Secondary | ICD-10-CM | POA: Diagnosis not present

## 2022-01-02 LAB — LDL CHOLESTEROL, DIRECT: Direct LDL: 136 mg/dL

## 2022-01-02 LAB — COMPREHENSIVE METABOLIC PANEL
ALT: 44 U/L (ref 0–53)
AST: 23 U/L (ref 0–37)
Albumin: 4.7 g/dL (ref 3.5–5.2)
Alkaline Phosphatase: 58 U/L (ref 39–117)
BUN: 11 mg/dL (ref 6–23)
CO2: 32 mEq/L (ref 19–32)
Calcium: 9.8 mg/dL (ref 8.4–10.5)
Chloride: 101 mEq/L (ref 96–112)
Creatinine, Ser: 1.2 mg/dL (ref 0.40–1.50)
GFR: 78.44 mL/min (ref >60.00)
Glucose, Bld: 91 mg/dL (ref 70–99)
Potassium: 4.5 mEq/L (ref 3.5–5.1)
Sodium: 137 mEq/L (ref 135–145)
Total Bilirubin: 0.6 mg/dL (ref 0.2–1.2)
Total Protein: 7.5 g/dL (ref 6.0–8.3)

## 2022-01-02 LAB — LIPID PANEL
Cholesterol: 227 mg/dL — ABNORMAL HIGH (ref 0–200)
HDL: 36.4 mg/dL — ABNORMAL LOW (ref >39.00)
NonHDL: 190.9
Total CHOL/HDL Ratio: 6
Triglycerides: 355 mg/dL — ABNORMAL HIGH (ref 0.0–149.0)
VLDL: 71 mg/dL — ABNORMAL HIGH (ref 0.0–40.0)

## 2022-01-02 MED ORDER — AMLODIPINE BESYLATE 10 MG PO TABS: 10.0000 mg | ORAL_TABLET | Freq: Every day | ORAL | 1 refills | Status: DC

## 2022-01-02 NOTE — Progress Notes (Signed)
Subjective:  Patient ID: Adam Alvarez, male    DOB: [birth year]  Age: 36 y.o. 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  Stage I hypertension based on in office readings.  Slight elevated creatinine few days prior.  Plan for lab work including c-Met, lipid panel.  Was not drawn.  He did start amlodipine 2.5 mg daily.  Plan for 1 month follow-up.  Taking 26m dose (2 of the 2.57mdose) few weeks ago 131/78 on home reading. Elevated BP 141/87 yesterday at DOT physical.  Still having some elevated readings.  No swelling in ankles. Sleeping better. Taking oxybutynin for sweating.  No HA, facial flushing, CP or heart palpitations.  No ankle swelling.   BP Readings from Last 3 Encounters:  01/02/22 (!) 140/98  10/15/21 138/78  08/10/20 (!) 125/110   Lab Results  Component Value Date   CREATININE 1.43 (H) 12/19/2018    History Patient Active Problem List   Diagnosis Date Noted   Vitamin D deficiency 12/20/2018   Hyperhidrosis 12/17/2017   Past Medical History:  Diagnosis Date   Back pain    Glaucoma    Hyperhydrosis disorder    Hypertension    Lactose intolerance    Migraine    Past Surgical History:  Procedure Laterality Date   JOINT REPLACEMENT     KNEE SURGERY     No Known Allergies Prior to Admission medications   Medication Sig Start Date End Date Taking? Authorizing Provider  amLODipine (NORVASC) 2.5 MG tablet Take 1 tablet (2.5 mg total) by mouth daily. 10/15/21  Yes GrWendie AgresteMD  cetirizine (ZYRTEC) 10 MG tablet Take 1 tablet (10 mg total) by mouth daily. 04/29/21  Yes MoMaximiano CossNP  montelukast (SINGULAIR) 10 MG tablet Take 1 tablet (10 mg total) by mouth at bedtime. 04/29/21  Yes MoMaximiano CossNP  oxybutynin (DITROPAN) 5 MG tablet Take 1 tablet (5 mg total) by mouth 3 (three) times daily. 04/29/21  Yes MoMaximiano CossNP  oxybutynin (DITROPAN) 5 MG tablet Take 1 tablet by mouth 3 (three) times daily. 10/01/19  Yes [provider]  Vitamin D, Ergocalciferol, (DRISDOL) 1.25 MG (50000 UNIT) CAPS capsule Take 1 capsule (50,000 Units total) by mouth every 7 (seven) days. 04/29/21  Yes MoMaximiano CossNP  aluminum chloride (DRYSOL) 20 % external solution Apply topically at bedtime. To scalp, areas of hyperhidrosis. Avoid eyes, nose, mouth. Patient not taking: Reported on 01/02/2022 07/03/21   GrWendie AgresteMD  azelastine (ASTELIN) 0.1 % nasal spray Place 1 spray into both nostrils 2 (two) times daily. Use in each nostril as directed Patient not taking: Reported on 01/02/2022 04/29/21   MoMaximiano CossNP  olopatadine (PATANOL) 0.1 % ophthalmic solution Place 1 drop into both eyes 2 (two) times daily. Patient not taking: Reported on 10/15/2021 04/29/21   MoMaximiano CossNP  traZODone (DESYREL) 50 MG tablet Take 0.5-2 tablets (25-100 mg total) by mouth at bedtime as needed for sleep. 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  Eyes:  Negative for visual disturbance.  Respiratory:  Negative for cough, chest tightness and shortness of breath.   Cardiovascular:  Negative for chest pain, palpitations and leg swelling.  Gastrointestinal:  Negative for abdominal pain and blood in stool.  Neurological:  Negative for dizziness, light-headedness and headaches.     Objective:   Vitals:   01/02/22 0852 01/02/22 0907  BP: (!) 140/82 (!) 140/98  Pulse: 90   Temp: 98.5 F (36.9 C)   SpO2: 96%   Weight: 285 lb 3.2 oz (129.4 kg)   Height: _0  (1.905 m)      Physical Exam Vitals reviewed.  Constitutional:      Appearance: He is well-developed.  HENT:     Head: Normocephalic and atraumatic.  Neck:     Vascular: No carotid bruit or JVD.  Cardiovascular:     Rate and Rhythm: Normal rate and regular rhythm.     Heart sounds: Normal heart sounds. No murmur heard. Pulmonary:     Effort: Pulmonary effort is normal.     Breath sounds: Normal breath sounds. No rales.  Musculoskeletal:     Right lower leg: No edema.     Left lower leg: No edema.  Skin:    General: Skin is warm and dry.  Neurological:     Mental Status: He is alert and oriented to person, place, and time.  Psychiatric:        Mood and Affect: Mood normal.        Assessment & Plan:  Adam Alvarez is a 36 y.o. male . Elevated serum creatinine - Plan: amLODipine (NORVASC) 10 MG tablet  Essential hypertension - Plan: amLODipine (NORVASC) 10 MG tablet still uncontrolled, increase amlodipine to 10 mg as elevated on 5 mg dose at home.  Check labs today, ordered last visit.  Eval creatinine as prior elevation.  Handout given on appropriate management of blood pressure as well as conditions to lessen risk of false elevated reading.  May have component of whitecoat hypertension but still stage I on home  readings.  Recheck 1 month, RTC precautions, orthostatic/hypotension precautions with med changes discussed.  Meds ordered this encounter  Medications   amLODipine (NORVASC) 10 MG tablet    Sig: Take 1 tablet (10 mg total) by mouth daily.    Dispense:  90 tablet    Refill:  1   Patient Instructions  Increase amlodipine to 10 mg/day.  Watch for lightheadedness, dizziness or low blood pressure readings at that dose and let me know if those occur.  See information below on how to check blood  pressure most effectively as well as recommendations prior to having her blood pressure checked for DOT.  Follow-up with me in 1 month.  I will let you know if there are any concerns on labs.  Take care.   How to Take Your Blood Pressure Blood pressure is a measurement of how strongly your blood is pressing against the walls of your arteries. Arteries are blood vessels that carry blood from your heart throughout your body. Your health care provider takes your blood pressure at each office visit. You can also take your own blood pressure at home with a blood pressure monitor. You may need to take your own blood pressure to: Confirm a diagnosis of high blood pressure (hypertension). Monitor your blood pressure over time. Make sure your blood pressure medicine is working. Supplies needed: Blood pressure monitor. A chair to sit in. This should be a chair where you can sit upright with your back supported. Do not sit on a soft couch or an armchair. Table or desk. Small notebook and pencil or pen. How to prepare To get the most accurate reading, avoid the following for 30 minutes before you check your blood pressure: Drinking caffeine. Drinking alcohol. Eating. Smoking. Exercising. Five minutes before you check your blood pressure: Use the bathroom and urinate so that you have an empty bladder. Sit quietly in a chair. Do not talk. How to take your blood pressure To check your blood pressure, follow the  instructions in the manual that came with your blood pressure monitor. If you have a digital blood pressure monitor, the instructions may be as follows: Sit up straight in a chair. Place your feet on the floor. Do not cross your ankles or legs. Rest your left arm at the level of your heart on a table or desk or on the arm of a chair. Pull up your shirt sleeve. Wrap the blood pressure cuff around the upper part of your left arm, 1 inch (2.5 cm) above your elbow. It is best to wrap the cuff around bare skin. Fit the cuff snugly, but not too tightly, around your arm. You should be able to place only one finger between the cuff and your arm. Position the cord so that it rests in the bend of your elbow. Press the power button. Sit quietly while the cuff inflates and deflates. Read the digital reading on the monitor screen and write the numbers down (record them) in a notebook. Wait 2-3 minutes, then repeat the steps, starting at step 1. What does my blood pressure reading mean? A blood pressure reading consists of a higher number over a lower number. Ideally, your blood pressure should be below 120/80. The first ("top") number is called the systolic pressure. It is a measure of the pressure in your arteries as your heart beats. The second ("bottom") number is called the diastolic pressure. It is a measure of the pressure in your arteries as the heart relaxes. Blood pressure is classified into four stages. The following are the stages for adults who do not have a short-term serious illness or a chronic condition. Systolic pressure and diastolic pressure are measured in a unit called mm Hg (millimeters of mercury).  Normal Systolic pressure: below 889. Diastolic pressure: below 80. Elevated Systolic pressure: 169-450. Diastolic pressure: below 80. Hypertension stage 1 Systolic pressure: 388-828. Diastolic pressure: 00-34. Hypertension stage 2 Systolic pressure: 917 or above. Diastolic pressure: 90  or above. You can have elevated blood pressure or hypertension even if only the  systolic or only the diastolic number in your reading is higher than normal. Follow these instructions at home: Medicines Take over-the-counter and prescription medicines only as told by your health care provider. Tell your health care provider if you are having any side effects from blood pressure medicine. General instructions Check your blood pressure as often as recommended by your health care provider. Check your blood pressure at the same time every day. Take your monitor to the next appointment with your health care provider to make sure that: You are using it correctly. It provides accurate readings. Understand what your goal blood pressure numbers are. Keep all follow-up visits. This is important. General tips Your health care provider can suggest a reliable monitor that will meet your needs. There are several types of home blood pressure monitors. Choose a monitor that has an arm cuff. Do not choose a monitor that measures your blood pressure from your wrist or finger. Choose a cuff that wraps snugly, not too tight or too loose, around your upper arm. You should be able to fit only one finger between your arm and the cuff. You can buy a blood pressure monitor at most drugstores or online. Where to find more information American Heart Association: www.heart.org Contact a health care provider if: Your blood pressure is consistently high. Your blood pressure is suddenly low. Get help right away if: Your systolic blood pressure is higher than 180. Your diastolic blood pressure is higher than 120. These symptoms may be an emergency. Get help right away. Call 911. Do not wait to see if the symptoms will go away. Do not drive yourself to the hospital. Summary Blood pressure is a measurement of how strongly your blood is pressing against the walls of your arteries. A blood pressure reading consists of a  higher number over a lower number. Ideally, your blood pressure should be below 120/80. Check your blood pressure at the same time every day. Avoid caffeine, alcohol, smoking, and exercise for 30 minutes prior to checking your blood pressure. These agents can affect the accuracy of the blood pressure reading. This information is not intended to replace advice given to you by your health care provider. Make sure you discuss any questions you have with your health care provider. Document Revised: 10/17/2020 Document Reviewed: 10/17/2020 Elsevier Patient Education  2023 [place],   Merri Ray, MD [place], [facility] 01/02/22 9:48 AM

## 2022-01-02 NOTE — Patient Instructions (Addendum)
Increase amlodipine to 10 mg/day.  Watch for lightheadedness, dizziness or low blood pressure readings at that dose and let me know if those occur.  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  Follow-up with me in 1 month.  I will let you know if there are any concerns on labs.  Take care.   How to Take Your Blood Pressure Blood pressure is a measurement of how strongly your blood is pressing against the walls of your arteries. Arteries are blood vessels that carry blood from your heart throughout your body. Your health care provider takes your blood pressure at each office visit. You can also take your own blood pressure at home with a blood pressure monitor. You may need to take your own blood pressure to: Confirm a diagnosis of high blood pressure (hypertension). Monitor your blood pressure over time. Make sure your blood pressure medicine is working. Supplies needed: Blood pressure monitor. A chair to sit in. This should be a chair where you can sit upright with your back supported. Do not sit on a soft couch or an armchair. Table or desk. Small notebook and pencil or pen. How to prepare To get the most accurate reading, avoid the following for 30 minutes before you check your blood pressure: Drinking caffeine. Drinking alcohol. Eating. Smoking. Exercising. Five minutes before you check your blood pressure: Use the bathroom and urinate so that you have an empty bladder. Sit quietly in a chair. Do not talk. How to take your blood pressure To check your blood pressure, follow the instructions in the manual that came with your blood pressure monitor. If you have a digital blood pressure monitor, the instructions may be as follows: Sit up straight in a chair. Place your feet on the floor. Do not cross your ankles or legs. Rest your left arm at the level of your heart on a table or desk  or on the arm of a chair. Pull up your shirt sleeve. Wrap the blood pressure cuff around the upper part of your left arm, 1 inch (2.5 cm) above your elbow. It is best to wrap the cuff around bare skin. Fit the cuff snugly, but not too tightly, around your arm. You should be able to place only one finger between the cuff and your arm. Position the cord so that it rests in the bend of your elbow. Press the power button. Sit quietly while the cuff inflates and deflates. Read the digital reading on the monitor screen and write the numbers down (record them) in a notebook. Wait 2-3 minutes, then repeat the steps, starting at step 1. What does my blood pressure reading mean? A blood pressure reading consists of a higher number over a lower number. Ideally, your blood pressure should be below 120/80. The first ("top") number is called the systolic pressure. It is a measure of the pressure in your arteries as your heart beats. The second ("bottom") number is called the diastolic pressure. It is a measure of the pressure in your arteries as the heart relaxes. Blood pressure is classified into four stages. The following are the stages for adults who do not have a short-term serious illness or a chronic condition. Systolic pressure and diastolic pressure are measured in a unit called mm Hg (millimeters of mercury).  Normal Systolic pressure: below 120. Diastolic pressure: below 80. Elevated Systolic pressure: 120-129. Diastolic pressure: below 80. Hypertension  stage 1 Systolic pressure: 130-139. Diastolic pressure: 80-89. Hypertension stage 2 Systolic pressure: 140 or above. Diastolic pressure: 90 or above. You can have elevated blood pressure or hypertension even if only the systolic or only the diastolic number in your reading is higher than normal. Follow these instructions at home: Medicines Take over-the-counter and prescription medicines only as told by your health care provider. Tell your  health care provider if you are having any side effects from blood pressure medicine. General instructions Check your blood pressure as often as recommended by your health care provider. Check your blood pressure at the same time every day. Take your monitor to the next appointment with your health care provider to make sure that: You are using it correctly. It provides accurate readings. Understand what your goal blood pressure numbers are. Keep all follow-up visits. This is important. General tips Your health care provider can suggest a reliable monitor that will meet your needs. There are several types of home blood pressure monitors. Choose a monitor that has an arm cuff. Do not choose a monitor that measures your blood pressure from your wrist or finger. Choose a cuff that wraps snugly, not too tight or too loose, around your upper arm. You should be able to fit only one finger between your arm and the cuff. You can buy a blood pressure monitor at most drugstores or online. Where to find more information American Heart Association: www.heart.org Contact a health care provider if: Your blood pressure is consistently high. Your blood pressure is suddenly low. Get help right away if: Your systolic blood pressure is higher than 180. Your diastolic blood pressure is higher than 120. These symptoms may be an emergency. Get help right away. Call 911. Do not wait to see if the symptoms will go away. Do not drive yourself to the hospital. Summary Blood pressure is a measurement of how strongly your blood is pressing against the walls of your arteries. A blood pressure reading consists of a higher number over a lower number. Ideally, your blood pressure should be below 120/80. Check your blood pressure at the same time every day. Avoid caffeine, alcohol, smoking, and exercise for 30 minutes prior to checking your blood pressure. These agents can affect the accuracy of the blood pressure  reading. This information is not intended to replace advice given to you by your health care provider. Make sure you discuss any questions you have with your health care provider. Document Revised: 10/17/2020 Document Reviewed: 10/17/2020 Elsevier Patient Education  2023 [employer].

## 2022-01-06 ENCOUNTER — Ambulatory Visit: Payer: Managed Care, Other (non HMO) | Admitting: Family

## 2022-02-02 ENCOUNTER — Ambulatory Visit: Payer: Managed Care, Other (non HMO) | Admitting: Family Medicine

## 2023-02-11 ENCOUNTER — Other Ambulatory Visit: Payer: Self-pay | Admitting: Family Medicine

## 2023-02-11 DIAGNOSIS — I10 Essential (primary) hypertension: Secondary | ICD-10-CM

## 2023-02-11 DIAGNOSIS — R7989 Other specified abnormal findings of blood chemistry: Secondary | ICD-10-CM

## 2023-02-12 ENCOUNTER — Telehealth: Payer: Self-pay

## 2023-02-12 DIAGNOSIS — R7989 Other specified abnormal findings of blood chemistry: Secondary | ICD-10-CM

## 2023-02-12 DIAGNOSIS — I10 Essential (primary) hypertension: Secondary | ICD-10-CM

## 2023-02-12 NOTE — Telephone Encounter (Signed)
Copied from CRM [id number]. Topic: Clinical - Medication Refill >> Feb 11, 2023  4:41 PM Ernst Spell wrote: Most Recent Primary Care Visit:  Provider: Meredith Staggers R  Department: [facility]  Visit Type: SAME DAY  Date: 01/02/2022  Medication: amLODipine (NORVASC) 10 MG tablet  Has the patient contacted their pharmacy? No Patient's medication has expired.  Is this the correct pharmacy for this prescription? Yes  This is the patient's preferred pharmacy:  [facility] [address] [place], [place] - [address] [place] [address] Phone: [phone] Fax: [phone]   Has the prescription been filled recently? No  Is the patient out of the medication? Yes  Has the patient been seen for an appointment in the last year OR does the patient have an upcoming appointment? No  Can we respond through MyChart? Yes  Agent: Please be advised that Rx refills may take up to 3 business days. We ask that you follow-up with your pharmacy.

## 2023-02-13 MED ORDER — AMLODIPINE BESYLATE 10 MG PO TABS: 10.0000 mg | ORAL_TABLET | Freq: Every day | ORAL | 0 refills | Status: DC

## 2023-02-13 NOTE — Addendum Note (Signed)
Addended by: Meredith Staggers R on: 02/13/2023 04:36 PM   Modules accepted: Orders

## 2023-02-13 NOTE — Telephone Encounter (Signed)
Overdue for visit, last appointment in November 2023, amlodipine 10 mg daily at that time with 1 month follow-up planned.  Noted he has an appointment January 2.  Temporary refill provided until that appointment but no further refills without office visit.

## 2023-02-18 ENCOUNTER — Ambulatory Visit: Payer: Self-pay | Admitting: Family Medicine

## 2023-02-18 ENCOUNTER — Encounter: Payer: Self-pay | Admitting: Family Medicine

## 2023-03-25 ENCOUNTER — Ambulatory Visit: Payer: Self-pay | Admitting: Family Medicine

## 2023-04-04 ENCOUNTER — Other Ambulatory Visit: Payer: Self-pay | Admitting: Family Medicine

## 2023-04-04 DIAGNOSIS — R7989 Other specified abnormal findings of blood chemistry: Secondary | ICD-10-CM

## 2023-04-04 DIAGNOSIS — I10 Essential (primary) hypertension: Secondary | ICD-10-CM
